# Patient Record
Sex: Female | Born: 1935
Health system: Southern US, Community
[De-identification: ages and names within clinical notes are randomized; demographics above are authoritative.]

## PROBLEM LIST (undated history)

## (undated) DIAGNOSIS — Z974 Presence of external hearing-aid: Secondary | ICD-10-CM

## (undated) DIAGNOSIS — N390 Urinary tract infection, site not specified: Secondary | ICD-10-CM

## (undated) DIAGNOSIS — M1990 Unspecified osteoarthritis, unspecified site: Secondary | ICD-10-CM

## (undated) DIAGNOSIS — E039 Hypothyroidism, unspecified: Secondary | ICD-10-CM

## (undated) DIAGNOSIS — I10 Essential (primary) hypertension: Secondary | ICD-10-CM

## (undated) DIAGNOSIS — M810 Age-related osteoporosis without current pathological fracture: Secondary | ICD-10-CM

## (undated) DIAGNOSIS — J301 Allergic rhinitis due to pollen: Secondary | ICD-10-CM

## (undated) HISTORY — DX: Essential (primary) hypertension: I10

## (undated) HISTORY — DX: Allergic rhinitis due to pollen: J30.1

## (undated) HISTORY — DX: Hypothyroidism, unspecified: E03.9

## (undated) HISTORY — DX: Age-related osteoporosis without current pathological fracture: M81.0

---

## 1982-06-25 HISTORY — PX: TOTAL ABDOMINAL HYSTERECTOMY: SHX209

## 2005-11-22 ENCOUNTER — Ambulatory Visit: Payer: Self-pay | Admitting: Internal Medicine

## 2006-01-29 ENCOUNTER — Ambulatory Visit: Payer: Self-pay

## 2006-01-29 ENCOUNTER — Emergency Department: Payer: Self-pay | Admitting: Emergency Medicine

## 2007-07-01 ENCOUNTER — Ambulatory Visit: Payer: Self-pay | Admitting: Internal Medicine

## 2008-11-19 ENCOUNTER — Ambulatory Visit: Payer: Self-pay | Admitting: Internal Medicine

## 2009-11-23 ENCOUNTER — Ambulatory Visit: Payer: Self-pay | Admitting: Internal Medicine

## 2011-07-11 ENCOUNTER — Ambulatory Visit: Payer: Self-pay

## 2012-10-20 ENCOUNTER — Ambulatory Visit: Payer: Self-pay | Admitting: Internal Medicine

## 2013-04-27 ENCOUNTER — Ambulatory Visit: Payer: Self-pay

## 2013-07-08 ENCOUNTER — Ambulatory Visit: Payer: Self-pay | Admitting: Family Medicine

## 2013-07-08 DIAGNOSIS — S0003XA Contusion of scalp, initial encounter: Secondary | ICD-10-CM | POA: Diagnosis not present

## 2013-07-08 DIAGNOSIS — S1093XA Contusion of unspecified part of neck, initial encounter: Secondary | ICD-10-CM | POA: Diagnosis not present

## 2013-07-08 DIAGNOSIS — R51 Headache: Secondary | ICD-10-CM | POA: Diagnosis not present

## 2013-07-08 DIAGNOSIS — S0083XA Contusion of other part of head, initial encounter: Secondary | ICD-10-CM | POA: Diagnosis not present

## 2013-07-13 DIAGNOSIS — E2839 Other primary ovarian failure: Secondary | ICD-10-CM | POA: Diagnosis not present

## 2013-09-17 DIAGNOSIS — E782 Mixed hyperlipidemia: Secondary | ICD-10-CM | POA: Diagnosis not present

## 2013-09-17 DIAGNOSIS — E038 Other specified hypothyroidism: Secondary | ICD-10-CM | POA: Diagnosis not present

## 2013-09-17 DIAGNOSIS — I10 Essential (primary) hypertension: Secondary | ICD-10-CM | POA: Diagnosis not present

## 2013-09-17 DIAGNOSIS — Z0000 Encounter for general adult medical examination without abnormal findings: Secondary | ICD-10-CM | POA: Diagnosis not present

## 2013-09-17 DIAGNOSIS — E559 Vitamin D deficiency, unspecified: Secondary | ICD-10-CM | POA: Diagnosis not present

## 2013-09-23 DIAGNOSIS — R5381 Other malaise: Secondary | ICD-10-CM | POA: Diagnosis not present

## 2013-09-23 DIAGNOSIS — R5383 Other fatigue: Secondary | ICD-10-CM | POA: Diagnosis not present

## 2013-09-23 DIAGNOSIS — E039 Hypothyroidism, unspecified: Secondary | ICD-10-CM | POA: Diagnosis not present

## 2013-09-23 DIAGNOSIS — L658 Other specified nonscarring hair loss: Secondary | ICD-10-CM | POA: Diagnosis not present

## 2014-03-22 DIAGNOSIS — E039 Hypothyroidism, unspecified: Secondary | ICD-10-CM | POA: Diagnosis not present

## 2014-03-22 DIAGNOSIS — I10 Essential (primary) hypertension: Secondary | ICD-10-CM | POA: Diagnosis not present

## 2014-03-22 DIAGNOSIS — N390 Urinary tract infection, site not specified: Secondary | ICD-10-CM | POA: Diagnosis not present

## 2014-03-22 DIAGNOSIS — M810 Age-related osteoporosis without current pathological fracture: Secondary | ICD-10-CM | POA: Diagnosis not present

## 2014-04-27 DIAGNOSIS — H2513 Age-related nuclear cataract, bilateral: Secondary | ICD-10-CM | POA: Diagnosis not present

## 2014-04-28 DIAGNOSIS — Z23 Encounter for immunization: Secondary | ICD-10-CM | POA: Diagnosis not present

## 2014-08-03 DIAGNOSIS — E039 Hypothyroidism, unspecified: Secondary | ICD-10-CM | POA: Diagnosis not present

## 2014-08-03 DIAGNOSIS — I10 Essential (primary) hypertension: Secondary | ICD-10-CM | POA: Diagnosis not present

## 2014-08-03 DIAGNOSIS — M810 Age-related osteoporosis without current pathological fracture: Secondary | ICD-10-CM | POA: Diagnosis not present

## 2014-08-03 DIAGNOSIS — N390 Urinary tract infection, site not specified: Secondary | ICD-10-CM | POA: Diagnosis not present

## 2014-08-03 DIAGNOSIS — R300 Dysuria: Secondary | ICD-10-CM | POA: Diagnosis not present

## 2014-08-03 DIAGNOSIS — R319 Hematuria, unspecified: Secondary | ICD-10-CM | POA: Diagnosis not present

## 2014-09-23 DIAGNOSIS — R319 Hematuria, unspecified: Secondary | ICD-10-CM | POA: Diagnosis not present

## 2014-09-23 DIAGNOSIS — I10 Essential (primary) hypertension: Secondary | ICD-10-CM | POA: Diagnosis not present

## 2014-09-23 DIAGNOSIS — N390 Urinary tract infection, site not specified: Secondary | ICD-10-CM | POA: Diagnosis not present

## 2014-09-23 DIAGNOSIS — E039 Hypothyroidism, unspecified: Secondary | ICD-10-CM | POA: Diagnosis not present

## 2014-09-23 DIAGNOSIS — M810 Age-related osteoporosis without current pathological fracture: Secondary | ICD-10-CM | POA: Diagnosis not present

## 2014-11-30 DIAGNOSIS — Z0001 Encounter for general adult medical examination with abnormal findings: Secondary | ICD-10-CM | POA: Diagnosis not present

## 2014-11-30 DIAGNOSIS — E559 Vitamin D deficiency, unspecified: Secondary | ICD-10-CM | POA: Diagnosis not present

## 2014-11-30 DIAGNOSIS — I10 Essential (primary) hypertension: Secondary | ICD-10-CM | POA: Diagnosis not present

## 2014-11-30 DIAGNOSIS — E039 Hypothyroidism, unspecified: Secondary | ICD-10-CM | POA: Diagnosis not present

## 2014-12-02 DIAGNOSIS — I10 Essential (primary) hypertension: Secondary | ICD-10-CM | POA: Diagnosis not present

## 2014-12-02 DIAGNOSIS — N390 Urinary tract infection, site not specified: Secondary | ICD-10-CM | POA: Diagnosis not present

## 2014-12-02 DIAGNOSIS — R300 Dysuria: Secondary | ICD-10-CM | POA: Diagnosis not present

## 2014-12-02 DIAGNOSIS — E039 Hypothyroidism, unspecified: Secondary | ICD-10-CM | POA: Diagnosis not present

## 2014-12-02 DIAGNOSIS — Z0001 Encounter for general adult medical examination with abnormal findings: Secondary | ICD-10-CM | POA: Diagnosis not present

## 2014-12-02 DIAGNOSIS — M810 Age-related osteoporosis without current pathological fracture: Secondary | ICD-10-CM | POA: Diagnosis not present

## 2014-12-04 DIAGNOSIS — Z111 Encounter for screening for respiratory tuberculosis: Secondary | ICD-10-CM | POA: Diagnosis not present

## 2014-12-23 DIAGNOSIS — Z1231 Encounter for screening mammogram for malignant neoplasm of breast: Secondary | ICD-10-CM | POA: Diagnosis not present

## 2015-07-21 DIAGNOSIS — I10 Essential (primary) hypertension: Secondary | ICD-10-CM | POA: Diagnosis not present

## 2015-07-21 DIAGNOSIS — E039 Hypothyroidism, unspecified: Secondary | ICD-10-CM | POA: Diagnosis not present

## 2015-07-21 DIAGNOSIS — N390 Urinary tract infection, site not specified: Secondary | ICD-10-CM | POA: Diagnosis not present

## 2015-07-21 DIAGNOSIS — M810 Age-related osteoporosis without current pathological fracture: Secondary | ICD-10-CM | POA: Diagnosis not present

## 2015-12-08 DIAGNOSIS — M810 Age-related osteoporosis without current pathological fracture: Secondary | ICD-10-CM | POA: Diagnosis not present

## 2015-12-08 DIAGNOSIS — R300 Dysuria: Secondary | ICD-10-CM | POA: Diagnosis not present

## 2015-12-08 DIAGNOSIS — E039 Hypothyroidism, unspecified: Secondary | ICD-10-CM | POA: Diagnosis not present

## 2015-12-08 DIAGNOSIS — N390 Urinary tract infection, site not specified: Secondary | ICD-10-CM | POA: Diagnosis not present

## 2015-12-08 DIAGNOSIS — Z0001 Encounter for general adult medical examination with abnormal findings: Secondary | ICD-10-CM | POA: Diagnosis not present

## 2015-12-08 DIAGNOSIS — I10 Essential (primary) hypertension: Secondary | ICD-10-CM | POA: Diagnosis not present

## 2016-01-23 DIAGNOSIS — D509 Iron deficiency anemia, unspecified: Secondary | ICD-10-CM | POA: Diagnosis not present

## 2016-01-23 DIAGNOSIS — E039 Hypothyroidism, unspecified: Secondary | ICD-10-CM | POA: Diagnosis not present

## 2016-01-23 DIAGNOSIS — M25511 Pain in right shoulder: Secondary | ICD-10-CM | POA: Diagnosis not present

## 2016-01-23 DIAGNOSIS — N390 Urinary tract infection, site not specified: Secondary | ICD-10-CM | POA: Diagnosis not present

## 2016-01-23 DIAGNOSIS — I10 Essential (primary) hypertension: Secondary | ICD-10-CM | POA: Diagnosis not present

## 2016-01-23 DIAGNOSIS — Z0001 Encounter for general adult medical examination with abnormal findings: Secondary | ICD-10-CM | POA: Diagnosis not present

## 2016-01-23 DIAGNOSIS — E559 Vitamin D deficiency, unspecified: Secondary | ICD-10-CM | POA: Diagnosis not present

## 2016-01-23 DIAGNOSIS — R5383 Other fatigue: Secondary | ICD-10-CM | POA: Diagnosis not present

## 2016-02-10 DIAGNOSIS — R399 Unspecified symptoms and signs involving the genitourinary system: Secondary | ICD-10-CM | POA: Diagnosis not present

## 2016-02-10 DIAGNOSIS — R531 Weakness: Secondary | ICD-10-CM | POA: Diagnosis not present

## 2016-02-14 DIAGNOSIS — D72819 Decreased white blood cell count, unspecified: Secondary | ICD-10-CM | POA: Diagnosis not present

## 2016-02-14 DIAGNOSIS — I10 Essential (primary) hypertension: Secondary | ICD-10-CM | POA: Diagnosis not present

## 2016-02-14 DIAGNOSIS — N390 Urinary tract infection, site not specified: Secondary | ICD-10-CM | POA: Diagnosis not present

## 2016-02-14 DIAGNOSIS — E039 Hypothyroidism, unspecified: Secondary | ICD-10-CM | POA: Diagnosis not present

## 2016-02-14 DIAGNOSIS — D696 Thrombocytopenia, unspecified: Secondary | ICD-10-CM | POA: Diagnosis not present

## 2016-02-28 DIAGNOSIS — D696 Thrombocytopenia, unspecified: Secondary | ICD-10-CM | POA: Diagnosis not present

## 2016-03-07 DIAGNOSIS — I10 Essential (primary) hypertension: Secondary | ICD-10-CM | POA: Diagnosis not present

## 2016-03-07 DIAGNOSIS — R300 Dysuria: Secondary | ICD-10-CM | POA: Diagnosis not present

## 2016-03-07 DIAGNOSIS — N390 Urinary tract infection, site not specified: Secondary | ICD-10-CM | POA: Diagnosis not present

## 2016-03-07 DIAGNOSIS — D72819 Decreased white blood cell count, unspecified: Secondary | ICD-10-CM | POA: Diagnosis not present

## 2016-04-20 DIAGNOSIS — M25511 Pain in right shoulder: Secondary | ICD-10-CM | POA: Diagnosis not present

## 2016-04-24 DIAGNOSIS — M7541 Impingement syndrome of right shoulder: Secondary | ICD-10-CM | POA: Diagnosis not present

## 2016-05-02 DIAGNOSIS — M6281 Muscle weakness (generalized): Secondary | ICD-10-CM | POA: Diagnosis not present

## 2016-05-04 DIAGNOSIS — R609 Edema, unspecified: Secondary | ICD-10-CM | POA: Diagnosis not present

## 2016-05-04 DIAGNOSIS — M25611 Stiffness of right shoulder, not elsewhere classified: Secondary | ICD-10-CM | POA: Diagnosis not present

## 2016-05-04 DIAGNOSIS — M25511 Pain in right shoulder: Secondary | ICD-10-CM | POA: Diagnosis not present

## 2016-05-08 DIAGNOSIS — M25611 Stiffness of right shoulder, not elsewhere classified: Secondary | ICD-10-CM | POA: Diagnosis not present

## 2016-05-08 DIAGNOSIS — M25511 Pain in right shoulder: Secondary | ICD-10-CM | POA: Diagnosis not present

## 2016-05-11 DIAGNOSIS — M25511 Pain in right shoulder: Secondary | ICD-10-CM | POA: Diagnosis not present

## 2016-05-11 DIAGNOSIS — M25611 Stiffness of right shoulder, not elsewhere classified: Secondary | ICD-10-CM | POA: Diagnosis not present

## 2016-05-15 DIAGNOSIS — M25611 Stiffness of right shoulder, not elsewhere classified: Secondary | ICD-10-CM | POA: Diagnosis not present

## 2016-05-15 DIAGNOSIS — M25511 Pain in right shoulder: Secondary | ICD-10-CM | POA: Diagnosis not present

## 2016-05-21 DIAGNOSIS — M25511 Pain in right shoulder: Secondary | ICD-10-CM | POA: Diagnosis not present

## 2016-05-21 DIAGNOSIS — M25611 Stiffness of right shoulder, not elsewhere classified: Secondary | ICD-10-CM | POA: Diagnosis not present

## 2016-05-24 DIAGNOSIS — M25511 Pain in right shoulder: Secondary | ICD-10-CM | POA: Diagnosis not present

## 2016-05-24 DIAGNOSIS — M25611 Stiffness of right shoulder, not elsewhere classified: Secondary | ICD-10-CM | POA: Diagnosis not present

## 2016-06-04 DIAGNOSIS — N390 Urinary tract infection, site not specified: Secondary | ICD-10-CM | POA: Diagnosis not present

## 2016-06-04 DIAGNOSIS — I10 Essential (primary) hypertension: Secondary | ICD-10-CM | POA: Diagnosis not present

## 2016-06-04 DIAGNOSIS — M25511 Pain in right shoulder: Secondary | ICD-10-CM | POA: Diagnosis not present

## 2016-06-04 DIAGNOSIS — Z23 Encounter for immunization: Secondary | ICD-10-CM | POA: Diagnosis not present

## 2016-06-04 DIAGNOSIS — M810 Age-related osteoporosis without current pathological fracture: Secondary | ICD-10-CM | POA: Diagnosis not present

## 2016-06-06 ENCOUNTER — Ambulatory Visit (INDEPENDENT_AMBULATORY_CARE_PROVIDER_SITE_OTHER): Payer: Commercial Managed Care - HMO | Admitting: Podiatry

## 2016-06-06 ENCOUNTER — Other Ambulatory Visit: Payer: Self-pay | Admitting: *Deleted

## 2016-06-06 ENCOUNTER — Encounter: Payer: Self-pay | Admitting: Podiatry

## 2016-06-06 ENCOUNTER — Ambulatory Visit (INDEPENDENT_AMBULATORY_CARE_PROVIDER_SITE_OTHER): Payer: Commercial Managed Care - HMO

## 2016-06-06 VITALS — BP 142/83 | HR 66 | Resp 16

## 2016-06-06 DIAGNOSIS — G5762 Lesion of plantar nerve, left lower limb: Secondary | ICD-10-CM | POA: Diagnosis not present

## 2016-06-06 DIAGNOSIS — G5760 Lesion of plantar nerve, unspecified lower limb: Secondary | ICD-10-CM | POA: Diagnosis not present

## 2016-06-06 DIAGNOSIS — M2041 Other hammer toe(s) (acquired), right foot: Secondary | ICD-10-CM | POA: Diagnosis not present

## 2016-06-06 DIAGNOSIS — G588 Other specified mononeuropathies: Secondary | ICD-10-CM

## 2016-06-06 DIAGNOSIS — M2042 Other hammer toe(s) (acquired), left foot: Secondary | ICD-10-CM

## 2016-06-06 NOTE — Progress Notes (Signed)
   Subjective:    Patient ID: Samantha Dennis, female    DOB: 11/12/1933, 82 y.o.   MRN: [id number]  HPI: She presents today complaining of pain to the distal aspects of the third and fourth toes bilaterally. States that the tip of the toes her and that she had this once before and we diagnosed her with neuroma or neuritis and she would like to try that treatment again. States that she went through for alcohol treatments and the fifth when she did not need.  Review of Systems  Musculoskeletal: Positive for arthralgias.  All other systems reviewed and are negative.      Objective:   Physical Exam: Vital signs are stable she's alert and oriented 3. Pulses are palpable. Neurologic sensorium is intact. No reproducible Mulder's click no pain on palpation to the third interdigital space. Hammertoe deformities are present and fairly rigid nature. As confirmed by radiographs today with osteoarthritic changes in the midfoot and forefoot. No fractures are identified. No open lesions or wounds are noted.     Assessment & Plan:  Hammertoe deformities with neuritis third and fourth toes lateral.  Plan: After discussion with her recommending that is more than likely the curvature of the toes rather than the tips of the toes or a neuroma I conceded and injected her third interdigital space of dehydrated alcohol will follow up with her in 3 weeks for another bilateral injection

## 2016-06-27 ENCOUNTER — Encounter: Payer: Self-pay | Admitting: Podiatry

## 2016-06-27 ENCOUNTER — Ambulatory Visit (INDEPENDENT_AMBULATORY_CARE_PROVIDER_SITE_OTHER): Payer: Commercial Managed Care - HMO | Admitting: Podiatry

## 2016-06-27 DIAGNOSIS — G5762 Lesion of plantar nerve, left lower limb: Secondary | ICD-10-CM

## 2016-06-27 DIAGNOSIS — G588 Other specified mononeuropathies: Secondary | ICD-10-CM

## 2016-06-27 DIAGNOSIS — G5760 Lesion of plantar nerve, unspecified lower limb: Secondary | ICD-10-CM | POA: Diagnosis not present

## 2016-06-27 NOTE — Progress Notes (Signed)
She presents today for follow-up of neuroma third interdigital space bilateral states that his still somewhat bothersome but is better than it was before.  Objective: She has strong palpable pulses palpable neuroma third interdigital space bilateral. Tenderness on palpation.  Assessment: Pain in limb secondary to neuroma third interdigital space bilateral.  Plan: Reinjected her second dose of dehydrated alcohol to the third interdigital space today.

## 2016-07-18 ENCOUNTER — Ambulatory Visit: Payer: Commercial Managed Care - HMO | Admitting: Podiatry

## 2016-11-01 DIAGNOSIS — N390 Urinary tract infection, site not specified: Secondary | ICD-10-CM | POA: Diagnosis not present

## 2016-11-01 DIAGNOSIS — I10 Essential (primary) hypertension: Secondary | ICD-10-CM | POA: Diagnosis not present

## 2016-11-01 DIAGNOSIS — R319 Hematuria, unspecified: Secondary | ICD-10-CM | POA: Diagnosis not present

## 2016-11-02 DIAGNOSIS — N390 Urinary tract infection, site not specified: Secondary | ICD-10-CM | POA: Diagnosis not present

## 2016-11-08 DIAGNOSIS — M545 Low back pain: Secondary | ICD-10-CM | POA: Diagnosis not present

## 2016-11-08 DIAGNOSIS — R319 Hematuria, unspecified: Secondary | ICD-10-CM | POA: Diagnosis not present

## 2016-11-12 ENCOUNTER — Other Ambulatory Visit: Payer: Self-pay | Admitting: Nurse Practitioner

## 2016-11-12 DIAGNOSIS — R319 Hematuria, unspecified: Secondary | ICD-10-CM

## 2016-11-27 ENCOUNTER — Ambulatory Visit: Payer: Medicare HMO

## 2016-12-11 DIAGNOSIS — N390 Urinary tract infection, site not specified: Secondary | ICD-10-CM | POA: Diagnosis not present

## 2016-12-11 DIAGNOSIS — E039 Hypothyroidism, unspecified: Secondary | ICD-10-CM | POA: Diagnosis not present

## 2016-12-11 DIAGNOSIS — I10 Essential (primary) hypertension: Secondary | ICD-10-CM | POA: Diagnosis not present

## 2016-12-11 DIAGNOSIS — M810 Age-related osteoporosis without current pathological fracture: Secondary | ICD-10-CM | POA: Diagnosis not present

## 2016-12-11 DIAGNOSIS — Z0001 Encounter for general adult medical examination with abnormal findings: Secondary | ICD-10-CM | POA: Diagnosis not present

## 2016-12-11 DIAGNOSIS — R55 Syncope and collapse: Secondary | ICD-10-CM | POA: Diagnosis not present

## 2016-12-11 DIAGNOSIS — E785 Hyperlipidemia, unspecified: Secondary | ICD-10-CM | POA: Diagnosis not present

## 2016-12-31 DIAGNOSIS — R55 Syncope and collapse: Secondary | ICD-10-CM | POA: Diagnosis not present

## 2017-01-07 DIAGNOSIS — R55 Syncope and collapse: Secondary | ICD-10-CM | POA: Diagnosis not present

## 2017-03-27 DIAGNOSIS — L821 Other seborrheic keratosis: Secondary | ICD-10-CM | POA: Diagnosis not present

## 2017-03-27 DIAGNOSIS — D2262 Melanocytic nevi of left upper limb, including shoulder: Secondary | ICD-10-CM | POA: Diagnosis not present

## 2017-03-27 DIAGNOSIS — D2261 Melanocytic nevi of right upper limb, including shoulder: Secondary | ICD-10-CM | POA: Diagnosis not present

## 2017-03-27 DIAGNOSIS — D225 Melanocytic nevi of trunk: Secondary | ICD-10-CM | POA: Diagnosis not present

## 2017-04-24 DIAGNOSIS — Z0001 Encounter for general adult medical examination with abnormal findings: Secondary | ICD-10-CM | POA: Diagnosis not present

## 2017-04-24 DIAGNOSIS — I10 Essential (primary) hypertension: Secondary | ICD-10-CM | POA: Diagnosis not present

## 2017-04-24 DIAGNOSIS — E039 Hypothyroidism, unspecified: Secondary | ICD-10-CM | POA: Diagnosis not present

## 2017-04-24 DIAGNOSIS — M810 Age-related osteoporosis without current pathological fracture: Secondary | ICD-10-CM | POA: Diagnosis not present

## 2017-05-30 ENCOUNTER — Other Ambulatory Visit: Payer: Self-pay | Admitting: Internal Medicine

## 2017-05-30 DIAGNOSIS — E785 Hyperlipidemia, unspecified: Secondary | ICD-10-CM | POA: Diagnosis not present

## 2017-05-30 DIAGNOSIS — N905 Atrophy of vulva: Secondary | ICD-10-CM | POA: Diagnosis not present

## 2017-05-30 DIAGNOSIS — Z1231 Encounter for screening mammogram for malignant neoplasm of breast: Secondary | ICD-10-CM

## 2017-05-30 DIAGNOSIS — E039 Hypothyroidism, unspecified: Secondary | ICD-10-CM | POA: Diagnosis not present

## 2017-05-30 DIAGNOSIS — Z23 Encounter for immunization: Secondary | ICD-10-CM | POA: Diagnosis not present

## 2017-05-30 DIAGNOSIS — A6004 Herpesviral vulvovaginitis: Secondary | ICD-10-CM | POA: Diagnosis not present

## 2017-06-06 ENCOUNTER — Ambulatory Visit (INDEPENDENT_AMBULATORY_CARE_PROVIDER_SITE_OTHER): Payer: Medicare HMO | Admitting: Internal Medicine

## 2017-06-06 ENCOUNTER — Ambulatory Visit: Payer: Self-pay | Admitting: Internal Medicine

## 2017-06-06 ENCOUNTER — Encounter: Payer: Self-pay | Admitting: Internal Medicine

## 2017-06-06 VITALS — BP 161/76 | HR 79 | Resp 16 | Ht 61.0 in | Wt 141.2 lb

## 2017-06-06 DIAGNOSIS — R030 Elevated blood-pressure reading, without diagnosis of hypertension: Secondary | ICD-10-CM

## 2017-06-06 DIAGNOSIS — F418 Other specified anxiety disorders: Secondary | ICD-10-CM | POA: Diagnosis not present

## 2017-06-06 DIAGNOSIS — M810 Age-related osteoporosis without current pathological fracture: Secondary | ICD-10-CM | POA: Diagnosis not present

## 2017-06-06 DIAGNOSIS — R4589 Other symptoms and signs involving emotional state: Secondary | ICD-10-CM

## 2017-06-06 DIAGNOSIS — N952 Postmenopausal atrophic vaginitis: Secondary | ICD-10-CM

## 2017-06-06 MED ORDER — FAMCICLOVIR 250 MG PO TABS: 250.0000 mg | ORAL_TABLET | Freq: Every day | ORAL | 3 refills | Status: DC

## 2017-06-06 NOTE — Progress Notes (Signed)
[facility] [place], [place] [address]  Internal MEDICINE  Office Visit Note  Patient Name: Samantha Dennis  [id number]  [id number]  Date of Service: 06/06/2017     Complaints/HPI:  Pt is here with few concerns. 1. She is in a new relationship and wants to know about preventive therapy for remote HSV. 2. She is c/o vaginal irritation after intercourse. 3. She has been having anxiety due to this  4. Wants a standing prescription for Cipro for ongoing UTI  Current Medication: Outpatient Encounter Medications as of 06/06/2017  Medication Sig Note  . alendronate (FOSAMAX) 70 MG tablet  06/06/2016: Received from: External Pharmacy  . aspirin EC 81 MG tablet Take by mouth. 06/06/2016: Received from: [facility]: Take 81 mg by mouth once daily.  Marland Kitchen levothyroxine (SYNTHROID, LEVOTHROID) 75 MCG tablet  06/06/2016: Received from: [facility]  . lisinopril-hydrochlorothiazide (PRINZIDE,ZESTORETIC) 10-12.5 MG tablet  06/06/2016: Received from: [facility]   No facility-administered encounter medications on file as of 06/06/2017.     Surgical History: Past Surgical History:  Procedure Laterality Date  . TOTAL ABDOMINAL HYSTERECTOMY Bilateral 1984    Medical History: Past Medical History:  Diagnosis Date  . Hay fever   . Hypertension   . Hypothyroidism   . Osteoporosis     Family History: No family history on file.  Social History: Social History   Socioeconomic History  . Marital status: Married    Spouse name: Not on file  . Number of children: Not on file  . Years of education: Not on file  . Highest education level: Not on file  Social Needs  . Financial resource strain: Not on file  . Food insecurity - worry: Not on file  . Food insecurity - inability: Not on file  . Transportation needs - medical: Not on file  . Transportation needs - non-medical: Not on file  Occupational  History  . Not on file  Tobacco Use  . Smoking status: Never Smoker  . Smokeless tobacco: Never Used  Substance and Sexual Activity  . Alcohol use: Yes    Alcohol/week: 0.6 oz    Types: 1 Glasses of wine per week  . Drug use: No  . Sexual activity: Not on file  Other Topics Concern  . Not on file  Social History Narrative  . Not on file     ROS  General: (-) fever, (-) chills, (-) night sweats, (-) weakness, (-) changes in appetite. Skin: (-) rashes, (-) itching,. Eyes: (-) visual changes, (-) redness, (-) itching, (-) double or blurred vision. Nose and Sinuses: (-) nasal stuffiness or itchiness, (-) postnasal drip, (-) nosebleeds, (-) sinus trouble. Mouth and Throat: (-) sore throat, (-) hoarseness. Neck: (-) swollen glands, (-) enlarged thyroid, (-) neck pain. Respiratory: (-) cough, (-) bloody sputum, (-) shortness of breath, (-) wheezing. Cardiovascular: (-) ankle swelling, (-) chest pain. Lymphatic: (-) lymph node enlargement, (-) lymph node tenderness. Neurologic: (-) numbness, (-) tingling,(-) dizziness. Psychiatric: (-) anxiety, (-) depression. Vaginal irritation//   Vital Signs: Blood pressure (!) 161/76, pulse 79, resp. rate 16, height 5\' 1"  (1.549 m), weight 141 lb 3.2 oz (64 kg), SpO2 99 %.  Examination: General Appearance: The patient is well-developed, well-nourished, and anxious  Skin: Gross inspection of skin demonstrates no evidence of abnormality. Head: Patient's head is normocephalic, no gross deformities. Eyes: no gross deformities noted. ENT: ears appear grossly normal. Nasopharynx appears to be normal. Neck:  Supple. No thyromegaly. No LAD. Respiratory: Lungs are clear to auscultation with no adventitious sounds. Cardiovascular: Normal S1 and S2 without murmur or rub. Extremities: No cyanosis. pulses are equal. Neurologic: Alert and oriented. No involuntary movements. Pelvic exam is performed and cultures are taken for HSVII  Assessment and  Plan:   Encounter Diagnoses  Name Primary?  . Elevated blood pressure reading   . Atrophic vaginitis Yes  . Anxiety about health   Elevated Blood pressure . Monitor BP. Anxiety related  Start premarin vaginal cream, Apply 1/3 of applicator qweek Ressurance is given RX of Cipro and Famvir is sent to pharmacy   General Counseling: I have discussed the findings of the evaluation and examination with Samantha Dennis.  I have also discussed any further diagnostic evaluation thatmay be needed or ordered today. Samantha Dennis verbalizes understanding of the findings of todays visit. We also reviewed her medications today and discussed drug interactions and side effects including but not limited excessive drowsiness and altered mental states. We also discussed that there is always a risk not just to her but also people around her. she has been encouraged to call the office with any questions or concerns that should arise related to todays visit.    Time spent: 30  I have personally obtained a history, examined the patient, evaluated laboratory and imaging results, formulated the assessment and plan and placed orders.   [name], MD  Internal Medicine

## 2017-06-06 NOTE — Patient Instructions (Addendum)
Atrophic Vaginitis Atrophic vaginitis is a condition in which the tissues that line the vagina become dry and thin. This condition is most common in women who have stopped having regular menstrual periods (menopause). This usually starts when a woman is 45-81 years old. Estrogen helps to keep the vagina moist. It stimulates the vagina to produce a clear fluid that lubricates the vagina for sexual intercourse. This fluid also protects the vagina from infection. Lack of estrogen can cause the lining of the vagina to get thinner and dryer. The vagina may also shrink in size. It may become less elastic. Atrophic vaginitis tends to get worse over time as a woman's estrogen level drops. What are the causes? This condition is caused by the normal drop in estrogen that happens around the time of menopause. What increases the risk? Certain conditions or situations may lower a woman's estrogen level, which increases her risk of atrophic vaginitis. These include:  Taking medicine that blocks estrogen.  Having ovaries removed surgically.  Being treated for cancer with X-ray treatment (radiation) or medicines (chemotherapy).  Exercising very hard and often.  Having an eating disorder (anorexia).  Giving birth or breastfeeding.  Being over the age of 50.  Smoking.  What are the signs or symptoms? Symptoms of this condition include:  Pain, soreness, or bleeding during sexual intercourse (dyspareunia).  Vaginal burning, irritation, or itching.  Pain or bleeding during a vaginal examination using a speculum (pelvic exam).  Loss of interest in sexual activity.  Having burning pain when passing urine.  Vaginal discharge that is brown or yellow.  In some cases, there are no symptoms. How is this diagnosed? This condition is diagnosed with a medical history and physical exam. This will include a pelvic exam that checks whether the inside of your vagina appears pale, thin, or dry. Rarely, you may  also have other tests, including:  A urine test.  A test that checks the acid balance in your vaginal fluid (acid balance test).  How is this treated? Treatment for this condition may depend on the severity of your symptoms. Treatment may include:  Using an over-the-counter vaginal lubricant before you have sexual intercourse.  Using a long-acting vaginal moisturizer.  Using low-dose vaginal estrogen for moderate to severe symptoms that do not respond to other treatments. Options include creams, tablets, and inserts (vaginal rings). Before using vaginal estrogen, tell your health care provider if you have a history of: ? Breast cancer. ? Endometrial cancer. ? Blood clots.  Taking medicines. You may be able to take a daily pill for dyspareunia. Discuss all of the risks of this medicine with your health care provider. It is usually not recommended for women who have a family history or personal history of breast cancer.  If your symptoms are very mild and you are not sexually active, you may not need treatment. Follow these instructions at home:  Take medicines only as directed by your health care provider. Do not use herbal or alternative medicines unless your health care provider says that you can.  Use over-the-counter creams, lubricants, or moisturizers for dryness only as directed by your health care provider.  If your atrophic vaginitis is caused by menopause, discuss all of your menopausal symptoms and treatment options with your health care provider.  Do not douche.  Do not use products that can make your vagina dry. These include: ? Scented feminine sprays. ? Scented tampons. ? Scented soaps.  If it hurts to have sex, talk with your sexual   partner. Contact a health care provider if:  Your discharge looks different than normal.  Your vagina has an unusual smell.  You have new symptoms.  Your symptoms do not improve with treatment.  Your symptoms get worse. This  information is not intended to replace advice given to you by your health care provider. Make sure you discuss any questions you have with your health care provider. Document Released: 10/26/2014 Document Revised: 11/17/2015 Document Reviewed: 06/02/2014 Elsevier Interactive Patient Education  2018 [employer] Inc.  

## 2017-06-06 NOTE — Progress Notes (Deleted)
Pt is here for a sick visit. Pt is complaining of Vaginal pain and irritation

## 2017-06-06 NOTE — Progress Notes (Deleted)
   Subjective:    Patient ID: Samantha Dennis, female    DOB: 04-12-1934, 83 y.o.   MRN: [id number]  Pt is here for a sick visit. Pt is complaining of ***      Review of Systems     Objective:   Physical Exam  Genitourinary: There is tenderness on the left labia.          Assessment & Plan:

## 2017-07-01 ENCOUNTER — Ambulatory Visit
Admission: RE | Admit: 2017-07-01 | Discharge: 2017-07-01 | Disposition: A | Discharge status: Home / Self Care | Payer: Medicare HMO | Source: Ambulatory Visit | Attending: Internal Medicine | Admitting: Internal Medicine

## 2017-07-01 DIAGNOSIS — Z1231 Encounter for screening mammogram for malignant neoplasm of breast: Secondary | ICD-10-CM | POA: Insufficient documentation

## 2017-07-03 ENCOUNTER — Other Ambulatory Visit: Payer: Self-pay | Admitting: *Deleted

## 2017-07-03 ENCOUNTER — Inpatient Hospital Stay
Admission: RE | Admit: 2017-07-03 | Discharge: 2017-07-03 | Disposition: A | Discharge status: Home / Self Care | Payer: Self-pay | Source: Ambulatory Visit | Attending: *Deleted | Admitting: *Deleted

## 2017-07-03 DIAGNOSIS — Z9289 Personal history of other medical treatment: Secondary | ICD-10-CM

## 2017-07-12 DIAGNOSIS — L820 Inflamed seborrheic keratosis: Secondary | ICD-10-CM | POA: Diagnosis not present

## 2017-07-12 DIAGNOSIS — L538 Other specified erythematous conditions: Secondary | ICD-10-CM | POA: Diagnosis not present

## 2017-07-12 DIAGNOSIS — L648 Other androgenic alopecia: Secondary | ICD-10-CM | POA: Diagnosis not present

## 2017-10-21 ENCOUNTER — Other Ambulatory Visit: Payer: Self-pay | Admitting: Internal Medicine

## 2017-10-21 MED ORDER — LEVOTHYROXINE SODIUM 75 MCG PO TABS: 75.0000 ug | ORAL_TABLET | Freq: Every day | ORAL | 3 refills | Status: DC

## 2017-12-12 ENCOUNTER — Telehealth: Payer: Self-pay

## 2017-12-12 NOTE — Telephone Encounter (Signed)
Patient order for cologuard has been cancelled due to order has expired exceeded 365 days, if needs reordered will need to submit back to laboratory. Beth

## 2017-12-17 ENCOUNTER — Ambulatory Visit: Payer: Self-pay | Admitting: Internal Medicine

## 2018-01-07 ENCOUNTER — Ambulatory Visit (INDEPENDENT_AMBULATORY_CARE_PROVIDER_SITE_OTHER): Payer: Medicare HMO | Admitting: Adult Health

## 2018-01-07 ENCOUNTER — Encounter: Payer: Self-pay | Admitting: Internal Medicine

## 2018-01-07 ENCOUNTER — Other Ambulatory Visit: Payer: Self-pay | Admitting: Internal Medicine

## 2018-01-07 VITALS — BP 128/68 | HR 79 | Resp 16 | Ht 60.0 in | Wt 137.8 lb

## 2018-01-07 DIAGNOSIS — N952 Postmenopausal atrophic vaginitis: Secondary | ICD-10-CM

## 2018-01-07 DIAGNOSIS — M810 Age-related osteoporosis without current pathological fracture: Secondary | ICD-10-CM | POA: Diagnosis not present

## 2018-01-07 DIAGNOSIS — I10 Essential (primary) hypertension: Secondary | ICD-10-CM | POA: Diagnosis not present

## 2018-01-07 NOTE — Progress Notes (Signed)
[facility] [place], [place] [address]  Internal MEDICINE  Office Visit Note  Patient Name: Samantha Dennis  [id number]  [id number]  Date of Service: 01/07/2018  Chief Complaint  Patient presents with  . Follow-up  . Hypertension  . Hypothyroidism    HPI Pt here for follow up.  She has no complaints at this time and reports she feels great. She reports she has stopped using the Premarin cream she was prescribed at her last visit.  She states after reading about it, she didn't want to use it anymore.      Current Medication: Outpatient Encounter Medications as of 01/07/2018  Medication Sig Note  . alendronate (FOSAMAX) 70 MG tablet  06/06/2016: Received from: External Pharmacy  . famciclovir (FAMVIR) 250 MG tablet Take 1 tablet (250 mg total) by mouth daily.   Marland Kitchen levothyroxine (SYNTHROID, LEVOTHROID) 75 MCG tablet Take 1 tablet (75 mcg total) by mouth daily before breakfast.   . lisinopril-hydrochlorothiazide (PRINZIDE,ZESTORETIC) 10-12.5 MG tablet  06/06/2016: Received from: [facility]  . aspirin EC 81 MG tablet Take by mouth. 06/06/2016: Received from: [facility]: Take 81 mg by mouth once daily.   No facility-administered encounter medications on file as of 01/07/2018.     Surgical History: Past Surgical History:  Procedure Laterality Date  . TOTAL ABDOMINAL HYSTERECTOMY Bilateral 1984    Medical History: Past Medical History:  Diagnosis Date  . Hay fever   . Hypertension   . Hypothyroidism   . Osteoporosis     Family History: Family History  Problem Relation Age of Onset  . Breast cancer Neg Hx     Social History   Socioeconomic History  . Marital status: Married    Spouse name: Not on file  . Number of children: Not on file  . Years of education: Not on file  . Highest education level: Not on file  Occupational History  . Not on file  Social Needs  . Financial resource strain:  Not on file  . Food insecurity:    Worry: Not on file    Inability: Not on file  . Transportation needs:    Medical: Not on file    Non-medical: Not on file  Tobacco Use  . Smoking status: Never Smoker  . Smokeless tobacco: Never Used  Substance and Sexual Activity  . Alcohol use: Yes    Alcohol/week: 0.6 oz    Types: 1 Glasses of wine per week  . Drug use: No  . Sexual activity: Not on file  Lifestyle  . Physical activity:    Days per week: Not on file    Minutes per session: Not on file  . Stress: Not on file  Relationships  . Social connections:    Talks on phone: Not on file    Gets together: Not on file    Attends religious service: Not on file    Active member of club or organization: Not on file    Attends meetings of clubs or organizations: Not on file    Relationship status: Not on file  . Intimate partner violence:    Fear of current or ex partner: Not on file    Emotionally abused: Not on file    Physically abused: Not on file    Forced sexual activity: Not on file  Other Topics Concern  . Not on file  Social History Narrative  . Not on file  Review of Systems  Constitutional: Negative for chills, fatigue and unexpected weight change.  HENT: Negative for congestion, rhinorrhea, sneezing and sore throat.   Eyes: Negative for photophobia, pain and redness.  Respiratory: Negative for cough, chest tightness and shortness of breath.   Cardiovascular: Negative for chest pain and palpitations.  Gastrointestinal: Negative for abdominal pain, constipation, diarrhea, nausea and vomiting.  Endocrine: Negative.   Genitourinary: Negative for dysuria and frequency.  Musculoskeletal: Negative for arthralgias, back pain, joint swelling and neck pain.  Skin: Negative for rash.  Allergic/Immunologic: Negative.   Neurological: Negative for tremors and numbness.  Hematological: Negative for adenopathy. Does not bruise/bleed easily.  Psychiatric/Behavioral: Negative  for behavioral problems and sleep disturbance. The patient is not nervous/anxious.     Vital Signs: BP 128/68   Pulse 79   Resp 16   Ht 5' (1.524 m)   Wt 137 lb 12.8 oz (62.5 kg)   SpO2 96%   BMI 26.91 kg/m    Physical Exam  Constitutional: She is oriented to person, place, and time. She appears well-developed and well-nourished. No distress.  HENT:  Head: Normocephalic and atraumatic.  Mouth/Throat: Oropharynx is clear and moist. No oropharyngeal exudate.  Eyes: Pupils are equal, round, and reactive to light. EOM are normal.  Neck: Normal range of motion. Neck supple. No JVD present. No tracheal deviation present. No thyromegaly present.  Cardiovascular: Normal rate, regular rhythm and normal heart sounds. Exam reveals no gallop and no friction rub.  No murmur heard. Pulmonary/Chest: Effort normal and breath sounds normal. No respiratory distress. She has no wheezes. She has no rales. She exhibits no tenderness.  Abdominal: Soft. There is no tenderness. There is no guarding.  Musculoskeletal: Normal range of motion.  Lymphadenopathy:    She has no cervical adenopathy.  Neurological: She is alert and oriented to person, place, and time. No cranial nerve deficit.  Skin: Skin is warm and dry. She is not diaphoretic.  Psychiatric: She has a normal mood and affect. Her behavior is normal. Judgment and thought content normal.  Nursing note and vitals reviewed.   Assessment/Plan: 1. Atrophic vaginitis No longer using medication. Denies complaints.     2. Age-related osteoporosis without current pathological fracture Continue Fosamax as prescribed.   3. Essential hypertension, benign Continue to monitor  General Counseling: Samantha Dennis understanding of the findings of todays visit and agrees with plan of treatment. I have discussed any further diagnostic evaluation that may be needed or ordered today. We also reviewed her medications today. she has been encouraged to call the  office with any questions or concerns that should arise related to todays visit.   Time spent: 20 Minutes   This patient was seen by Orson Gear AGNP-C in Collaboration with Dr [name] as a part of collaborative care agreement    Dr [name] Internal medicine

## 2018-05-29 ENCOUNTER — Ambulatory Visit: Payer: Self-pay | Admitting: Adult Health

## 2018-05-30 ENCOUNTER — Other Ambulatory Visit: Payer: Self-pay | Admitting: Internal Medicine

## 2018-06-23 ENCOUNTER — Other Ambulatory Visit: Payer: Self-pay

## 2018-06-23 MED ORDER — ROSUVASTATIN CALCIUM 5 MG PO TABS: 5.0000 mg | ORAL_TABLET | Freq: Every day | ORAL | 1 refills | Status: DC

## 2018-06-24 ENCOUNTER — Ambulatory Visit: Payer: Self-pay | Admitting: Nurse Practitioner

## 2018-07-01 ENCOUNTER — Other Ambulatory Visit: Payer: Self-pay

## 2018-07-01 ENCOUNTER — Telehealth: Payer: Self-pay

## 2018-07-02 ENCOUNTER — Other Ambulatory Visit: Payer: Self-pay | Admitting: Nurse Practitioner

## 2018-07-02 DIAGNOSIS — E039 Hypothyroidism, unspecified: Secondary | ICD-10-CM | POA: Diagnosis not present

## 2018-07-02 DIAGNOSIS — Z0001 Encounter for general adult medical examination with abnormal findings: Secondary | ICD-10-CM | POA: Diagnosis not present

## 2018-07-02 DIAGNOSIS — I10 Essential (primary) hypertension: Secondary | ICD-10-CM | POA: Diagnosis not present

## 2018-07-02 DIAGNOSIS — E559 Vitamin D deficiency, unspecified: Secondary | ICD-10-CM | POA: Diagnosis not present

## 2018-07-02 NOTE — Telephone Encounter (Signed)
GAVE TO COURTNEY TO CALL HER AND NOTIFY HER.

## 2018-07-02 NOTE — Telephone Encounter (Signed)
I filled out a lab slip. She can pick it up or we can mail it.

## 2018-07-03 LAB — TSH: TSH: 2.37 u[IU]/mL (ref 0.450–4.500)

## 2018-07-03 LAB — COMPREHENSIVE METABOLIC PANEL
ALT: 19 IU/L (ref 0–32)
AST: 22 IU/L (ref 0–40)
Albumin/Globulin Ratio: 2.3 — ABNORMAL HIGH (ref 1.2–2.2)
Albumin: 4.5 g/dL (ref 3.5–4.7)
Alkaline Phosphatase: 44 IU/L (ref 39–117)
BUN / CREAT RATIO: 19 (ref 12–28)
BUN: 13 mg/dL (ref 8–27)
Bilirubin Total: 0.7 mg/dL (ref 0.0–1.2)
CO2: 26 mmol/L (ref 20–29)
Calcium: 9.2 mg/dL (ref 8.7–10.3)
Chloride: 100 mmol/L (ref 96–106)
Creatinine, Ser: 0.68 mg/dL (ref 0.57–1.00)
GFR calc non Af Amer: 82 mL/min/{1.73_m2} (ref >59)
GFR, EST AFRICAN AMERICAN: 94 mL/min/{1.73_m2} (ref >59)
Globulin, Total: 2 g/dL (ref 1.5–4.5)
Glucose: 86 mg/dL (ref 65–99)
Potassium: 4.2 mmol/L (ref 3.5–5.2)
Sodium: 140 mmol/L (ref 134–144)
TOTAL PROTEIN: 6.5 g/dL (ref 6.0–8.5)

## 2018-07-03 LAB — CBC
HEMATOCRIT: 43.8 % (ref 34.0–46.6)
Hemoglobin: 14.8 g/dL (ref 11.1–15.9)
MCH: 31.9 pg (ref 26.6–33.0)
MCHC: 33.8 g/dL (ref 31.5–35.7)
MCV: 94 fL (ref 79–97)
Platelets: 180 10*3/uL (ref 150–450)
RBC: 4.64 x10E6/uL (ref 3.77–5.28)
RDW: 12.8 % (ref 11.7–15.4)
WBC: 5.6 10*3/uL (ref 3.4–10.8)

## 2018-07-03 LAB — LIPID PANEL W/O CHOL/HDL RATIO
Cholesterol, Total: 192 mg/dL (ref 100–199)
HDL: 96 mg/dL (ref >39)
LDL Calculated: 83 mg/dL (ref 0–99)
Triglycerides: 63 mg/dL (ref 0–149)
VLDL Cholesterol Cal: 13 mg/dL (ref 5–40)

## 2018-07-03 LAB — VITAMIN D 25 HYDROXY (VIT D DEFICIENCY, FRACTURES): Vit D, 25-Hydroxy: 46.9 ng/mL (ref 30.0–100.0)

## 2018-07-03 LAB — T4, FREE: Free T4: 1.64 ng/dL (ref 0.82–1.77)

## 2018-07-03 LAB — T3: T3, Total: 82 ng/dL (ref 71–180)

## 2018-07-04 ENCOUNTER — Encounter: Payer: Self-pay | Admitting: Nurse Practitioner

## 2018-07-04 ENCOUNTER — Ambulatory Visit (INDEPENDENT_AMBULATORY_CARE_PROVIDER_SITE_OTHER): Payer: Medicare HMO | Admitting: Nurse Practitioner

## 2018-07-04 VITALS — BP 141/61 | HR 65 | Resp 16 | Ht 61.0 in | Wt 140.0 lb

## 2018-07-04 DIAGNOSIS — Z0001 Encounter for general adult medical examination with abnormal findings: Secondary | ICD-10-CM | POA: Diagnosis not present

## 2018-07-04 DIAGNOSIS — R300 Dysuria: Secondary | ICD-10-CM

## 2018-07-04 DIAGNOSIS — M810 Age-related osteoporosis without current pathological fracture: Secondary | ICD-10-CM

## 2018-07-04 DIAGNOSIS — Z23 Encounter for immunization: Secondary | ICD-10-CM | POA: Diagnosis not present

## 2018-07-04 DIAGNOSIS — I10 Essential (primary) hypertension: Secondary | ICD-10-CM

## 2018-07-04 NOTE — Progress Notes (Signed)
[facility], [place] [address]  Internal MEDICINE  Office Visit Note  Patient Name: Samantha Dennis  [id number]  [id number]  Date of Service: 07/09/2018   Pt is here for routine health maintenance examination   Chief Complaint  Patient presents with  . Medicare Wellness    6 month well visit  . Hypertension  . Quality Metric Gaps    pt will get the flu vaccine from pharmacy     Hypertension  This is a chronic problem. The current episode started more than 1 year ago. The problem is unchanged. The problem is controlled. Pertinent negatives include no chest pain, headaches, neck pain, palpitations or shortness of breath. Agents associated with hypertension include thyroid hormones. Risk factors for coronary artery disease include post-menopausal state. Past treatments include diuretics and ACE inhibitors. The current treatment provides moderate improvement. There are no compliance problems.     Current Medication: Outpatient Encounter Medications as of 07/04/2018  Medication Sig Note  . alendronate (FOSAMAX) 70 MG tablet  06/06/2016: Received from: External Pharmacy  . aspirin EC 81 MG tablet Take by mouth. 06/06/2016: Received from: [place]: Take 81 mg by mouth once daily.  . famciclovir (FAMVIR) 250 MG tablet TAKE 1 TABLET (250 MG TOTAL) BY MOUTH DAILY.   Marland Kitchen ibandronate (BONIVA) 150 MG tablet TAKE 1 TABLET EVERY MONTH ON AN EMPTY STOMACH   . levothyroxine (SYNTHROID, LEVOTHROID) 75 MCG tablet Take 1 tablet (75 mcg total) by mouth daily before breakfast.   . lisinopril-hydrochlorothiazide (PRINZIDE,ZESTORETIC) 10-12.5 MG tablet  06/06/2016: Received from: [facility]  . rosuvastatin (CRESTOR) 5 MG tablet Take 1 tablet (5 mg total) by mouth daily.    No facility-administered encounter medications on file as of 07/04/2018.     Surgical History: Past Surgical History:  Procedure Laterality Date   . TOTAL ABDOMINAL HYSTERECTOMY Bilateral 1984    Medical History: Past Medical History:  Diagnosis Date  . Hay fever   . Hypertension   . Hypothyroidism   . Osteoporosis     Family History: Family History  Problem Relation Age of Onset  . Breast cancer Neg Hx       Review of Systems  Constitutional: Negative for activity change, chills, fatigue and unexpected weight change.  HENT: Negative for congestion, postnasal drip, rhinorrhea, sneezing and sore throat.   Respiratory: Negative for cough, chest tightness, shortness of breath and wheezing.   Cardiovascular: Negative for chest pain and palpitations.  Gastrointestinal: Negative for abdominal pain, constipation, diarrhea, nausea and vomiting.  Endocrine: Negative for cold intolerance, heat intolerance, polydipsia and polyuria.  Genitourinary: Negative for dysuria, frequency, hematuria and urgency.  Musculoskeletal: Negative for arthralgias, back pain, joint swelling and neck pain.  Skin: Negative for rash.  Allergic/Immunologic: Negative for environmental allergies.  Neurological: Negative for dizziness, tremors, numbness and headaches.  Hematological: Negative for adenopathy. Does not bruise/bleed easily.  Psychiatric/Behavioral: Negative for behavioral problems (Depression), sleep disturbance and suicidal ideas. The patient is not nervous/anxious.      Today's Vitals   07/04/18 1109  BP: (!) 141/61  Pulse: 65  Resp: 16  SpO2: 99%  Weight: 140 lb (63.5 kg)  Height: 5\' 1"  (1.549 m)    Physical Exam Vitals signs and nursing note reviewed.  Constitutional:      General: She is not in acute distress.    Appearance: Normal appearance. She is well-developed. She is not diaphoretic.  HENT:     Head:  Normocephalic and atraumatic.     Mouth/Throat:     Pharynx: No oropharyngeal exudate.  Eyes:     Conjunctiva/sclera: Conjunctivae normal.     Pupils: Pupils are equal, round, and reactive to light.  Neck:      Musculoskeletal: Normal range of motion and neck supple.     Thyroid: No thyromegaly.     Vascular: No carotid bruit or JVD.     Trachea: No tracheal deviation.  Cardiovascular:     Rate and Rhythm: Normal rate and regular rhythm.     Pulses: Normal pulses.     Heart sounds: Normal heart sounds. No murmur. No friction rub. No gallop.   Pulmonary:     Effort: Pulmonary effort is normal. No respiratory distress.     Breath sounds: Normal breath sounds. No wheezing or rales.  Chest:     Chest wall: No tenderness.  Abdominal:     General: Bowel sounds are normal.     Palpations: Abdomen is soft.     Tenderness: There is no abdominal tenderness.  Musculoskeletal: Normal range of motion.  Lymphadenopathy:     Cervical: No cervical adenopathy.  Skin:    General: Skin is warm and dry.     Capillary Refill: Capillary refill takes less than 2 seconds.  Neurological:     General: No focal deficit present.     Mental Status: She is alert and oriented to person, place, and time.     Cranial Nerves: No cranial nerve deficit.  Psychiatric:        Behavior: Behavior normal.        Thought Content: Thought content normal.        Judgment: Judgment normal.    Depression screen [facility] 2/9 07/04/2018 01/07/2018 06/06/2017  Decreased Interest 0 0 1  Down, Depressed, Hopeless 0 0 0  PHQ - 2 Score 0 0 1    Functional Status Survey: Is the patient deaf or have difficulty hearing?: Yes(sometimes, seems like people mumbles and listen to the tv louder) Does the patient have difficulty seeing, even when wearing glasses/contacts?: No Does the patient have difficulty concentrating, remembering, or making decisions?: No Does the patient have difficulty walking or climbing stairs?: No Does the patient have difficulty dressing or bathing?: No Does the patient have difficulty doing errands alone such as visiting a doctor's office or shopping?: No  MMSE - [place] Exam 07/04/2018  Orientation to time  5  Orientation to Place 5  Registration 3  Attention/ Calculation 5  Recall 3  Language- name 2 objects 2  Language- repeat 1  Language- follow 3 step command 3  Language- read & follow direction 1  Write a sentence 1  Copy design 1  Total score 30    Fall Risk  07/04/2018 07/04/2018 01/07/2018 06/06/2017  Falls in the past year? 0 0 No No      LABS: Recent Results (from the past 2160 hour(s))  Comprehensive metabolic panel     Status: Abnormal   Collection Time: 07/02/18 11:09 AM  Result Value Ref Range   Glucose 86 65 - 99 mg/dL   BUN 13 8 - 27 mg/dL   Creatinine, Ser 0.68 0.57 - 1.00 mg/dL   GFR calc non Af Amer 82 >59 mL/min/1.73   GFR calc Af Amer 94 >59 mL/min/1.73   BUN/Creatinine Ratio 19 12 - 28   Sodium 140 134 - 144 mmol/L   Potassium 4.2 3.5 - 5.2 mmol/L   Chloride 100  96 - 106 mmol/L   CO2 26 20 - 29 mmol/L   Calcium 9.2 8.7 - 10.3 mg/dL   Total Protein 6.5 6.0 - 8.5 g/dL   Albumin 4.5 3.5 - 4.7 g/dL    Comment:     **Effective July 14, 2018 Albumin reference**       interval will be changing to:              Age                Female          Female           0 -  7 days        3.6 - 4.9      3.6 - 4.9           8 - 30 days        3.4 - 4.7      3.4 - 4.7           1 -  6 month       3.7 - 4.8      3.7 - 4.8    7 months -  2 years       3.9 - 5.0      3.9 - 5.0           3 -  5 years       4.0 - 5.0      4.0 - 5.0           6 - 12 years       4.1 - 5.0      4.0 - 5.0          13 - 30 years       4.1 - 5.2      3.9 - 5.0          31 - 50 years       4.0 - 5.0      3.8 - 4.8          51 - 60 years       3.8 - 4.9      3.8 - 4.9          61 - 70 years       3.8 - 4.8      3.8 - 4.8          71 - 80 years       3.7 - 4.7      3.7 - 4.7          81 - 89 years       3.6 - 4.6      3.6 - 4.6              >89 years       3.5 - 4.6      3.5 - 4.6    Globulin, Total 2.0 1.5 - 4.5 g/dL   Albumin/Globulin Ratio 2.3 (H) 1.2 - 2.2   Bilirubin Total 0.7 0.0 - 1.2  mg/dL   Alkaline Phosphatase 44 39 - 117 IU/L   AST 22 0 - 40 IU/L   ALT 19 0 - 32 IU/L  CBC     Status: None   Collection Time: 07/02/18 11:09 AM  Result Value Ref Range   WBC 5.6 3.4 - 10.8 x10E3/uL   RBC 4.64 3.77 - 5.28 x10E6/uL   Hemoglobin 14.8 11.1 - 15.9 g/dL  Hematocrit 43.8 34.0 - 46.6 %   MCV 94 79 - 97 fL   MCH 31.9 26.6 - 33.0 pg   MCHC 33.8 31.5 - 35.7 g/dL   RDW 12.8 11.7 - 15.4 %    Comment:               **Please note reference interval change**   Platelets 180 150 - 450 x10E3/uL  Lipid Panel w/o Chol/HDL Ratio     Status: None   Collection Time: 07/02/18 11:09 AM  Result Value Ref Range   Cholesterol, Total 192 100 - 199 mg/dL   Triglycerides 63 0 - 149 mg/dL   HDL 96 >39 mg/dL   VLDL Cholesterol Cal 13 5 - 40 mg/dL   LDL Calculated 83 0 - 99 mg/dL  T4, free     Status: None   Collection Time: 07/02/18 11:09 AM  Result Value Ref Range   Free T4 1.64 0.82 - 1.77 ng/dL  TSH     Status: None   Collection Time: 07/02/18 11:09 AM  Result Value Ref Range   TSH 2.370 0.450 - 4.500 uIU/mL  VITAMIN D 25 Hydroxy (Vit-D Deficiency, Fractures)     Status: None   Collection Time: 07/02/18 11:09 AM  Result Value Ref Range   Vit D, 25-Hydroxy 46.9 30.0 - 100.0 ng/mL    Comment: Vitamin D deficiency has been defined by the [place] and an Endocrine Society practice guideline as a level of serum 25-OH vitamin D less than 20 ng/mL (1,2). The Endocrine Society went on to further define vitamin D insufficiency as a level between 21 and 29 ng/mL (2). 1. IOM (Institute of Medicine). 2010. Dietary reference    intakes for calcium and D. [place]: The    [employer]. 2. Holick MF, Binkley Wardner, Bischoff-Ferrari HA, et al.    Evaluation, treatment, and prevention of vitamin D    deficiency: an Endocrine Society clinical practice    guideline. JCEM. 2011 Jul; 96(7):1911-30.   T3     Status: None   Collection Time: 07/02/18 11:09 AM  Result  Value Ref Range   T3, Total 82 71 - 180 ng/dL  UA/M w/rflx Culture, Routine     Status: None   Collection Time: 07/04/18 12:30 PM  Result Value Ref Range   Specific Gravity, UA 1.007 1.005 - 1.030   pH, UA 7.0 5.0 - 7.5   Color, UA Yellow Yellow   Appearance Ur Clear Clear   Leukocytes, UA Negative Negative   Protein, UA Negative Negative/Trace   Glucose, UA Negative Negative   Ketones, UA Negative Negative   RBC, UA Negative Negative   Bilirubin, UA Negative Negative   Urobilinogen, Ur 0.2 0.2 - 1.0 mg/dL   Nitrite, UA Negative Negative   Microscopic Examination Comment     Comment: Microscopic follows if indicated.   Microscopic Examination See below:     Comment: Microscopic was indicated and was performed.   Urinalysis Reflex Comment     Comment: This specimen will not reflex to a Urine Culture.  Microscopic Examination     Status: None   Collection Time: 07/04/18 12:30 PM  Result Value Ref Range   WBC, UA None seen 0 - 5 /hpf   RBC, UA 0-2 0 - 2 /hpf   Epithelial Cells (non renal) None seen 0 - 10 /hpf   Casts None seen None seen /lpf   Mucus, UA Present Not Estab.   Bacteria, UA None  seen None seen/Few    Assessment/Plan: 1. Encounter for general adult medical examination with abnormal findings Annual health maintenance exam today.  2. Essential hypertension, benign Stable. Continue bp medication as prescribed   3. Age-related osteoporosis without current pathological fracture Continue boniva once monthly  4. Need for vaccination against Streptococcus pneumoniae using pneumococcal conjugate vaccine 13 rx for prevnar 13 sent to her pharmacy for administration.  - Pneumococcal conjugate vaccine 13-valent IM  5. Dysuria - UA/M w/rflx Culture, Routine  General Counseling: Mansi verbalizes understanding of the findings of todays visit and agrees with plan of treatment. I have discussed any further diagnostic evaluation that may be needed or ordered today. We also  reviewed her medications today. she has been encouraged to call the office with any questions or concerns that should arise related to todays visit.    Counseling:  Hypertension Counseling:   The following hypertensive lifestyle modification were recommended and discussed:  1. Limiting alcohol intake to less than 1 oz/day of ethanol:(24 oz of beer or 8 oz of wine or 2 oz of 100-proof whiskey). 2. Take baby ASA 81 mg daily. 3. Importance of regular aerobic exercise and losing weight. 4. Reduce dietary saturated fat and cholesterol intake for overall cardiovascular health. 5. Maintaining adequate dietary potassium, calcium, and magnesium intake. 6. Regular monitoring of the blood pressure. 7. Reduce sodium intake to less than 100 mmol/day (less than 2.3 gm of sodium or less than 6 gm of sodium choride)   This patient was seen by [place] with Dr [name] as a part of collaborative care agreement  Orders Placed This Encounter  Procedures  . Microscopic Examination  . Pneumococcal conjugate vaccine 13-valent IM  . UA/M w/rflx Culture, Routine      Time spent: [place], MD  Internal Medicine

## 2018-07-05 LAB — UA/M W/RFLX CULTURE, ROUTINE
Bilirubin, UA: NEGATIVE
Glucose, UA: NEGATIVE
KETONES UA: NEGATIVE
Leukocytes, UA: NEGATIVE
Nitrite, UA: NEGATIVE
Protein, UA: NEGATIVE
RBC, UA: NEGATIVE
Specific Gravity, UA: 1.007 (ref 1.005–1.030)
UUROB: 0.2 mg/dL (ref 0.2–1.0)
pH, UA: 7 (ref 5.0–7.5)

## 2018-07-05 LAB — MICROSCOPIC EXAMINATION
Bacteria, UA: NONE SEEN
Casts: NONE SEEN /lpf
Epithelial Cells (non renal): NONE SEEN /hpf (ref 0–10)
WBC, UA: NONE SEEN /hpf (ref 0–5)

## 2018-07-09 DIAGNOSIS — Z1239 Encounter for other screening for malignant neoplasm of breast: Secondary | ICD-10-CM | POA: Insufficient documentation

## 2018-07-09 DIAGNOSIS — R300 Dysuria: Secondary | ICD-10-CM | POA: Insufficient documentation

## 2018-07-09 DIAGNOSIS — Z0001 Encounter for general adult medical examination with abnormal findings: Principal | ICD-10-CM

## 2018-07-09 DIAGNOSIS — I10 Essential (primary) hypertension: Secondary | ICD-10-CM | POA: Insufficient documentation

## 2018-07-09 DIAGNOSIS — Z23 Encounter for immunization: Secondary | ICD-10-CM | POA: Insufficient documentation

## 2018-08-18 DIAGNOSIS — D2261 Melanocytic nevi of right upper limb, including shoulder: Secondary | ICD-10-CM | POA: Diagnosis not present

## 2018-08-18 DIAGNOSIS — D2272 Melanocytic nevi of left lower limb, including hip: Secondary | ICD-10-CM | POA: Diagnosis not present

## 2018-08-18 DIAGNOSIS — L821 Other seborrheic keratosis: Secondary | ICD-10-CM | POA: Diagnosis not present

## 2018-08-18 DIAGNOSIS — D225 Melanocytic nevi of trunk: Secondary | ICD-10-CM | POA: Diagnosis not present

## 2018-08-18 DIAGNOSIS — D2271 Melanocytic nevi of right lower limb, including hip: Secondary | ICD-10-CM | POA: Diagnosis not present

## 2018-08-18 DIAGNOSIS — D2262 Melanocytic nevi of left upper limb, including shoulder: Secondary | ICD-10-CM | POA: Diagnosis not present

## 2018-08-25 ENCOUNTER — Other Ambulatory Visit: Payer: Self-pay

## 2018-08-25 MED ORDER — FAMCICLOVIR 250 MG PO TABS: 250.0000 mg | ORAL_TABLET | Freq: Every day | ORAL | 0 refills | Status: DC

## 2018-08-26 ENCOUNTER — Other Ambulatory Visit: Payer: Self-pay

## 2018-09-10 ENCOUNTER — Other Ambulatory Visit: Payer: Self-pay | Admitting: Internal Medicine

## 2018-09-30 ENCOUNTER — Other Ambulatory Visit: Payer: Self-pay

## 2018-09-30 MED ORDER — CIPROFLOXACIN HCL 500 MG PO TABS: 500.0000 mg | ORAL_TABLET | Freq: Two times a day (BID) | ORAL | 0 refills | Status: AC

## 2018-09-30 NOTE — Telephone Encounter (Signed)
Pt called that she keep cipro on hold for uti as per dr Humphrey Rolls send cipro for 10 days

## 2018-10-14 DIAGNOSIS — R399 Unspecified symptoms and signs involving the genitourinary system: Secondary | ICD-10-CM | POA: Diagnosis not present

## 2018-10-14 DIAGNOSIS — R319 Hematuria, unspecified: Secondary | ICD-10-CM | POA: Diagnosis not present

## 2018-10-14 DIAGNOSIS — N390 Urinary tract infection, site not specified: Secondary | ICD-10-CM | POA: Diagnosis not present

## 2018-11-26 ENCOUNTER — Encounter: Payer: Self-pay | Admitting: Nurse Practitioner

## 2018-11-26 ENCOUNTER — Ambulatory Visit (INDEPENDENT_AMBULATORY_CARE_PROVIDER_SITE_OTHER): Payer: Medicare HMO | Admitting: Nurse Practitioner

## 2018-11-26 ENCOUNTER — Other Ambulatory Visit: Payer: Self-pay

## 2018-11-26 ENCOUNTER — Other Ambulatory Visit: Payer: Self-pay | Admitting: Nurse Practitioner

## 2018-11-26 VITALS — BP 149/78 | HR 78 | Temp 97.8°F | Resp 16 | Ht 60.0 in | Wt 139.0 lb

## 2018-11-26 DIAGNOSIS — R300 Dysuria: Secondary | ICD-10-CM

## 2018-11-26 DIAGNOSIS — N3001 Acute cystitis with hematuria: Secondary | ICD-10-CM

## 2018-11-26 DIAGNOSIS — N390 Urinary tract infection, site not specified: Secondary | ICD-10-CM | POA: Diagnosis not present

## 2018-11-26 DIAGNOSIS — R319 Hematuria, unspecified: Secondary | ICD-10-CM | POA: Diagnosis not present

## 2018-11-26 LAB — POCT URINALYSIS DIPSTICK
Bilirubin, UA: NEGATIVE
Glucose, UA: NEGATIVE
Ketones, UA: NEGATIVE
Leukocytes, UA: NEGATIVE
Nitrite, UA: NEGATIVE
Protein, UA: NEGATIVE
Spec Grav, UA: <=1.005 — AB (ref 1.010–1.025)
Urobilinogen, UA: 0.2 E.U./dL
pH, UA: 6 (ref 5.0–8.0)

## 2018-11-26 MED ORDER — METHENAMINE HIPPURATE 1 G PO TABS: 1.0000 g | ORAL_TABLET | Freq: Two times a day (BID) | ORAL | 0 refills | Status: DC

## 2018-11-26 MED ORDER — PHENAZOPYRIDINE HCL 200 MG PO TABS: 200.0000 mg | ORAL_TABLET | Freq: Three times a day (TID) | ORAL | 0 refills | Status: DC | PRN

## 2018-11-26 MED ORDER — NITROFURANTOIN MONOHYD MACRO 100 MG PO CAPS: 100.0000 mg | ORAL_CAPSULE | Freq: Two times a day (BID) | ORAL | 0 refills | Status: DC

## 2018-11-26 NOTE — Progress Notes (Signed)
[facility] [place],  [address]  Internal MEDICINE  Office Visit Note  Patient Name: Samantha Dennis  [id number]  [id number]  Date of Service: 11/26/2018   Pt is here for a sick visit.  Chief Complaint  Patient presents with  . Medical Management of Chronic Issues  . Urinary Tract Infection     The patient states that she has been having left sided flank pain for last few days. Started a few weeks ago. She did go to urgent care and was diagnosed with UTI. Was started on cipro and took full 10 days worth of the antibiotic she states that she did get mostly better. Symptoms started to reappear about 5 days after she finished the antibiotics. Isn't sure if she was on the wrong antibiotic or if she just wasn't on them long enough. She denies abdominal pain, nausea, vomiting, or diarrhea. She denies fever.        Current Medication:  Outpatient Encounter Medications as of 11/26/2018  Medication Sig Note  . alendronate (FOSAMAX) 70 MG tablet  06/06/2016: Received from: External Pharmacy  . aspirin EC 81 MG tablet Take by mouth. 06/06/2016: Received from: [facility]: Take 81 mg by mouth once daily.  . famciclovir (FAMVIR) 250 MG tablet TAKE 1 TABLET (250 MG TOTAL) BY MOUTH DAILY.   Marland Kitchen ibandronate (BONIVA) 150 MG tablet TAKE 1 TABLET EVERY MONTH ON AN EMPTY STOMACH   . levothyroxine (SYNTHROID, LEVOTHROID) 75 MCG tablet TAKE 1 TABLET (75 MCG TOTAL) BY MOUTH DAILY BEFORE BREAKFAST.   Marland Kitchen lisinopril-hydrochlorothiazide (PRINZIDE,ZESTORETIC) 10-12.5 MG tablet TAKE 1 TABLET EVERY DAY   . rosuvastatin (CRESTOR) 5 MG tablet Take 1 tablet (5 mg total) by mouth daily.   . [DISCONTINUED] methenamine (HIPREX) 1 g tablet Take 1 g by mouth 2 (two) times daily with a meal.   . nitrofurantoin, macrocrystal-monohydrate, (MACROBID) 100 MG capsule Take 1 capsule (100 mg total) by mouth 2 (two) times daily.   . [DISCONTINUED] phenazopyridine  (PYRIDIUM) 200 MG tablet Take 1 tablet (200 mg total) by mouth 3 (three) times daily as needed for pain. 11/26/2018: not on preferred list    No facility-administered encounter medications on file as of 11/26/2018.       Medical History: Past Medical History:  Diagnosis Date  . Hay fever   . Hypertension   . Hypothyroidism   . Osteoporosis      Vital Signs: BP (!) 149/78   Pulse 78   Temp 97.8 F (36.6 C)   Resp 16   Ht 5' (1.524 m)   Wt 139 lb (63 kg)   SpO2 97%   BMI 27.15 kg/m    Review of Systems  Constitutional: Negative for activity change, chills, fatigue and unexpected weight change.  HENT: Negative for congestion, postnasal drip, rhinorrhea, sneezing and sore throat.   Respiratory: Negative for cough, chest tightness, shortness of breath and wheezing.   Cardiovascular: Negative for chest pain and palpitations.  Gastrointestinal: Negative for abdominal pain, constipation, diarrhea, nausea and vomiting.  Endocrine: Negative for cold intolerance, heat intolerance, polydipsia and polyuria.  Genitourinary: Positive for flank pain and hematuria. Negative for dysuria, frequency and urgency.  Musculoskeletal: Negative for arthralgias, back pain, joint swelling and neck pain.  Skin: Negative for rash.  Allergic/Immunologic: Negative for environmental allergies.  Neurological: Negative for dizziness, tremors, numbness and headaches.  Hematological: Negative for adenopathy. Does not bruise/bleed easily.  Psychiatric/Behavioral: Negative for behavioral problems (Depression), sleep disturbance and  suicidal ideas. The patient is not nervous/anxious.     Physical Exam Vitals signs and nursing note reviewed.  Constitutional:      General: She is not in acute distress.    Appearance: Normal appearance. She is well-developed. She is not diaphoretic.  HENT:     Head: Normocephalic and atraumatic.     Mouth/Throat:     Pharynx: No oropharyngeal exudate.  Eyes:      Conjunctiva/sclera: Conjunctivae normal.     Pupils: Pupils are equal, round, and reactive to light.  Neck:     Musculoskeletal: Normal range of motion and neck supple.     Thyroid: No thyromegaly.     Vascular: No carotid bruit or JVD.     Trachea: No tracheal deviation.  Cardiovascular:     Rate and Rhythm: Normal rate and regular rhythm.     Pulses: Normal pulses.     Heart sounds: Normal heart sounds. No murmur. No friction rub. No gallop.   Pulmonary:     Effort: Pulmonary effort is normal. No respiratory distress.     Breath sounds: Normal breath sounds. No wheezing or rales.  Chest:     Chest wall: No tenderness.  Abdominal:     General: Bowel sounds are normal.     Palpations: Abdomen is soft.     Tenderness: There is no abdominal tenderness.  Genitourinary:    Comments: Urine sample positive for trace blood. Musculoskeletal: Normal range of motion.  Lymphadenopathy:     Cervical: No cervical adenopathy.  Skin:    General: Skin is warm and dry.     Capillary Refill: Capillary refill takes less than 2 seconds.  Neurological:     General: No focal deficit present.     Mental Status: She is alert and oriented to person, place, and time.     Cranial Nerves: No cranial nerve deficit.  Psychiatric:        Behavior: Behavior normal.        Thought Content: Thought content normal.        Judgment: Judgment normal.   Assessment/Plan:  1. Urinary tract infection with hematuria, site unspecified Start macrobid 100mg  twice daily for 10 days. Send urine for culture and sensitivity and adjust antibiotics as indicated.  - nitrofurantoin, macrocrystal-monohydrate, (MACROBID) 100 MG capsule; Take 1 capsule (100 mg total) by mouth 2 (two) times daily.  Dispense: 20 capsule; Refill: 0  2. Dysuria Pyridium may be taken up to three times daily if needed for bladder pain and spasms.  - POCT Urinalysis Dipstick  3. Acute cystitis with hematuria - CULTURE, URINE  COMPREHENSIVE  General Counseling: Labrenda verbalizes understanding of the findings of todays visit and agrees with plan of treatment. I have discussed any further diagnostic evaluation that may be needed or ordered today. We also reviewed her medications today. she has been encouraged to call the office with any questions or concerns that should arise related to todays visit.    Counseling:   This patient was seen by Leretha Pol FNP Collaboration with Dr [name] as a part of collaborative care agreement  Orders Placed This Encounter  Procedures  . CULTURE, URINE COMPREHENSIVE  . POCT Urinalysis Dipstick    Meds ordered this encounter  Medications  . nitrofurantoin, macrocrystal-monohydrate, (MACROBID) 100 MG capsule    Sig: Take 1 capsule (100 mg total) by mouth 2 (two) times daily.    Dispense:  20 capsule    Refill:  0  Order Specific Question:   Supervising Provider    Answer:   [name] [[id number]]  . DISCONTD: phenazopyridine (PYRIDIUM) 200 MG tablet    Sig: Take 1 tablet (200 mg total) by mouth 3 (three) times daily as needed for pain.    Dispense:  10 tablet    Refill:  0    Order Specific Question:   Supervising Provider    Answer:   [name] [[id number]]    Time spent: 25 Minutes

## 2018-11-29 LAB — CULTURE, URINE COMPREHENSIVE

## 2018-12-09 ENCOUNTER — Encounter: Payer: Self-pay | Admitting: Nurse Practitioner

## 2018-12-09 ENCOUNTER — Other Ambulatory Visit: Payer: Self-pay

## 2018-12-09 ENCOUNTER — Ambulatory Visit (INDEPENDENT_AMBULATORY_CARE_PROVIDER_SITE_OTHER): Payer: Medicare HMO | Admitting: Nurse Practitioner

## 2018-12-09 VITALS — BP 132/70 | HR 63 | Resp 16 | Ht 60.0 in | Wt 141.0 lb

## 2018-12-09 DIAGNOSIS — N390 Urinary tract infection, site not specified: Secondary | ICD-10-CM

## 2018-12-09 DIAGNOSIS — R300 Dysuria: Secondary | ICD-10-CM | POA: Diagnosis not present

## 2018-12-09 DIAGNOSIS — I10 Essential (primary) hypertension: Secondary | ICD-10-CM | POA: Diagnosis not present

## 2018-12-09 DIAGNOSIS — R319 Hematuria, unspecified: Secondary | ICD-10-CM

## 2018-12-09 LAB — POCT URINALYSIS DIPSTICK
Bilirubin, UA: NEGATIVE
Glucose, UA: NEGATIVE
Ketones, UA: NEGATIVE
Leukocytes, UA: NEGATIVE
Nitrite, UA: NEGATIVE
Protein, UA: NEGATIVE
Spec Grav, UA: 1.01 (ref 1.010–1.025)
Urobilinogen, UA: 0.2 E.U./dL
pH, UA: 5 (ref 5.0–8.0)

## 2018-12-09 MED ORDER — NITROFURANTOIN MONOHYD MACRO 100 MG PO CAPS: 100.0000 mg | ORAL_CAPSULE | Freq: Two times a day (BID) | ORAL | 1 refills | Status: DC

## 2018-12-09 NOTE — Progress Notes (Signed)
[facility] [place], [place] [address]  Internal MEDICINE  Office Visit Note  Patient Name: Samantha Dennis  [id number]  [id number]  Date of Service: 12/09/2018  Chief Complaint  Patient presents with  . Medical Management of Chronic Issues     2 week follow up uti , feeling much better     The patent has completed second round of antibiotics for uti. First round was cipro. Just completed round macrobid 100mg  for 10 days. She states that she feels much better. Urine culture indicated norma urogenital flora. There is still a trace of blood in the urine.       Current Medication: Outpatient Encounter Medications as of 12/09/2018  Medication Sig Note  . alendronate (FOSAMAX) 70 MG tablet  06/06/2016: Received from: External Pharmacy  . aspirin EC 81 MG tablet Take by mouth. 06/06/2016: Received from: [place]: Take 81 mg by mouth once daily.  . famciclovir (FAMVIR) 250 MG tablet TAKE 1 TABLET (250 MG TOTAL) BY MOUTH DAILY.   Marland Kitchen ibandronate (BONIVA) 150 MG tablet TAKE 1 TABLET EVERY MONTH ON AN EMPTY STOMACH   . levothyroxine (SYNTHROID, LEVOTHROID) 75 MCG tablet TAKE 1 TABLET (75 MCG TOTAL) BY MOUTH DAILY BEFORE BREAKFAST.   Marland Kitchen lisinopril-hydrochlorothiazide (PRINZIDE,ZESTORETIC) 10-12.5 MG tablet TAKE 1 TABLET EVERY DAY   . methenamine (HIPREX) 1 g tablet Take 1 tablet (1 g total) by mouth 2 (two) times daily with a meal.   . nitrofurantoin, macrocrystal-monohydrate, (MACROBID) 100 MG capsule Take 1 capsule (100 mg total) by mouth 2 (two) times daily.   . rosuvastatin (CRESTOR) 5 MG tablet Take 1 tablet (5 mg total) by mouth daily.   . [DISCONTINUED] nitrofurantoin, macrocrystal-monohydrate, (MACROBID) 100 MG capsule Take 1 capsule (100 mg total) by mouth 2 (two) times daily. (Patient not taking: Reported on 12/09/2018)    No facility-administered encounter medications on file as of 12/09/2018.     Surgical History: Past  Surgical History:  Procedure Laterality Date  . TOTAL ABDOMINAL HYSTERECTOMY Bilateral 1984    Medical History: Past Medical History:  Diagnosis Date  . Hay fever   . Hypertension   . Hypothyroidism   . Osteoporosis     Family History: Family History  Problem Relation Age of Onset  . Breast cancer Neg Hx     Social History   Socioeconomic History  . Marital status: Married    Spouse name: Not on file  . Number of children: Not on file  . Years of education: Not on file  . Highest education level: Not on file  Occupational History  . Not on file  Social Needs  . Financial resource strain: Not on file  . Food insecurity    Worry: Not on file    Inability: Not on file  . Transportation needs    Medical: Not on file    Non-medical: Not on file  Tobacco Use  . Smoking status: Never Smoker  . Smokeless tobacco: Never Used  Substance and Sexual Activity  . Alcohol use: Yes    Alcohol/week: 1.0 standard drinks    Types: 1 Glasses of wine per week  . Drug use: No  . Sexual activity: Not on file  Lifestyle  . Physical activity    Days per week: Not on file    Minutes per session: Not on file  . Stress: Not on file  Relationships  . Social connections    Talks on phone: Not on file  Gets together: Not on file    Attends religious service: Not on file    Active member of club or organization: Not on file    Attends meetings of clubs or organizations: Not on file    Relationship status: Not on file  . Intimate partner violence    Fear of current or ex partner: Not on file    Emotionally abused: Not on file    Physically abused: Not on file    Forced sexual activity: Not on file  Other Topics Concern  . Not on file  Social History Narrative  . Not on file      Review of Systems  Constitutional: Negative for activity change, chills, fatigue and unexpected weight change.  HENT: Negative for congestion, postnasal drip, rhinorrhea, sneezing and sore throat.    Respiratory: Negative for cough, chest tightness, shortness of breath and wheezing.   Cardiovascular: Negative for chest pain and palpitations.  Gastrointestinal: Negative for abdominal pain, constipation, diarrhea, nausea and vomiting.  Endocrine: Negative for cold intolerance, heat intolerance, polydipsia and polyuria.  Genitourinary: Negative for dysuria, flank pain, frequency, hematuria and urgency.  Musculoskeletal: Negative for arthralgias, back pain, joint swelling and neck pain.  Skin: Negative for rash.  Allergic/Immunologic: Negative for environmental allergies.  Neurological: Negative for dizziness, tremors, numbness and headaches.  Hematological: Negative for adenopathy. Does not bruise/bleed easily.  Psychiatric/Behavioral: Negative for behavioral problems (Depression), sleep disturbance and suicidal ideas. The patient is not nervous/anxious.    Today's Vitals   12/09/18 1435  BP: 132/70  Pulse: 63  Resp: 16  SpO2: 99%  Weight: 141 lb (64 kg)  Height: 5' (1.524 m)   Body mass index is 27.54 kg/m.  Physical Exam Vitals signs and nursing note reviewed.  Constitutional:      General: She is not in acute distress.    Appearance: Normal appearance. She is well-developed. She is not diaphoretic.  HENT:     Head: Normocephalic and atraumatic.     Mouth/Throat:     Pharynx: No oropharyngeal exudate.  Eyes:     Conjunctiva/sclera: Conjunctivae normal.     Pupils: Pupils are equal, round, and reactive to light.  Neck:     Musculoskeletal: Normal range of motion and neck supple.     Thyroid: No thyromegaly.     Vascular: No carotid bruit or JVD.     Trachea: No tracheal deviation.  Cardiovascular:     Rate and Rhythm: Normal rate and regular rhythm.     Heart sounds: Normal heart sounds. No murmur. No friction rub. No gallop.   Pulmonary:     Effort: Pulmonary effort is normal. No respiratory distress.     Breath sounds: Normal breath sounds. No wheezing or rales.   Chest:     Chest wall: No tenderness.  Abdominal:     General: Bowel sounds are normal.     Palpations: Abdomen is soft.     Tenderness: There is no abdominal tenderness.  Genitourinary:    Comments: Urine sample positive for trace blood. Musculoskeletal: Normal range of motion.  Lymphadenopathy:     Cervical: No cervical adenopathy.  Skin:    General: Skin is warm and dry.     Capillary Refill: Capillary refill takes less than 2 seconds.  Neurological:     General: No focal deficit present.     Mental Status: She is alert and oriented to person, place, and time.     Cranial Nerves: No cranial nerve deficit.  Psychiatric:  Behavior: Behavior normal.        Thought Content: Thought content normal.        Judgment: Judgment normal.    Assessment/Plan:  1. Urinary tract infection with hematuria, site unspecified Resolved, but has been recurrent in the past. Sent prescription for macrobid 100mg  twice daily to take if UTI develops again. Patient to contact office if this is ineffective  - nitrofurantoin, macrocrystal-monohydrate, (MACROBID) 100 MG capsule; Take 1 capsule (100 mg total) by mouth 2 (two) times daily.  Dispense: 20 capsule; Refill: 1  2. Dysuria - POCT Urinalysis Dipstick positive for trace blood only.   3. Essential hypertension, benign Stable. Continue bp medication as prescribed   General Counseling: Leyda verbalizes understanding of the findings of todays visit and agrees with plan of treatment. I have discussed any further diagnostic evaluation that may be needed or ordered today. We also reviewed her medications today. she has been encouraged to call the office with any questions or concerns that should arise related to todays visit.  This patient was seen by Leretha Pol FNP Collaboration with Dr [name] as a part of collaborative care agreement  Orders Placed This Encounter  Procedures  . POCT Urinalysis Dipstick    Meds ordered this  encounter  Medications  . nitrofurantoin, macrocrystal-monohydrate, (MACROBID) 100 MG capsule    Sig: Take 1 capsule (100 mg total) by mouth 2 (two) times daily.    Dispense:  20 capsule    Refill:  1    Order Specific Question:   Supervising Provider    Answer:   [name] [[id number]]    Time spent: 68 Minutes      Dr [name] Internal medicine

## 2019-01-23 ENCOUNTER — Other Ambulatory Visit: Payer: Self-pay

## 2019-01-23 ENCOUNTER — Encounter: Payer: Self-pay | Admitting: Nurse Practitioner

## 2019-01-23 ENCOUNTER — Ambulatory Visit (INDEPENDENT_AMBULATORY_CARE_PROVIDER_SITE_OTHER): Payer: Medicare HMO | Admitting: Nurse Practitioner

## 2019-01-23 VITALS — BP 138/70 | HR 72 | Resp 16 | Ht 61.0 in | Wt 139.2 lb

## 2019-01-23 DIAGNOSIS — E782 Mixed hyperlipidemia: Secondary | ICD-10-CM | POA: Diagnosis not present

## 2019-01-23 DIAGNOSIS — I10 Essential (primary) hypertension: Secondary | ICD-10-CM

## 2019-01-23 DIAGNOSIS — M810 Age-related osteoporosis without current pathological fracture: Secondary | ICD-10-CM

## 2019-01-23 DIAGNOSIS — Z1239 Encounter for other screening for malignant neoplasm of breast: Secondary | ICD-10-CM | POA: Diagnosis not present

## 2019-01-23 NOTE — Progress Notes (Signed)
[facility] [place], [place] [address]  Internal MEDICINE  Office Visit Note  Patient Name: Samantha Dennis  [id number]  [id number]  Date of Service: 01/23/2019  Chief Complaint  Patient presents with  . Medical Management of Chronic Issues  . Hypertension  . Osteoporosis  . Hypothyroidism    The patient is here for routine follow up. She states that she is doing well. She has no concerns or complaints. Blood pressure is well controlled. Labs, done in 06/2018, were good. She is due to have screening mammogram.       Current Medication: Outpatient Encounter Medications as of 01/23/2019  Medication Sig Note  . alendronate (FOSAMAX) 70 MG tablet 70 mg once a week.  06/06/2016: Received from: External Pharmacy  . levothyroxine (SYNTHROID, LEVOTHROID) 75 MCG tablet TAKE 1 TABLET (75 MCG TOTAL) BY MOUTH DAILY BEFORE BREAKFAST.   Marland Kitchen lisinopril-hydrochlorothiazide (PRINZIDE,ZESTORETIC) 10-12.5 MG tablet TAKE 1 TABLET EVERY DAY   . rosuvastatin (CRESTOR) 5 MG tablet Take 1 tablet (5 mg total) by mouth daily.   . [DISCONTINUED] aspirin EC 81 MG tablet Take by mouth. 06/06/2016: Received from: [place]: Take 81 mg by mouth once daily.  . [DISCONTINUED] famciclovir (FAMVIR) 250 MG tablet TAKE 1 TABLET (250 MG TOTAL) BY MOUTH DAILY. (Patient not taking: Reported on 01/23/2019)   . [DISCONTINUED] ibandronate (BONIVA) 150 MG tablet TAKE 1 TABLET EVERY MONTH ON AN EMPTY STOMACH (Patient not taking: Reported on 01/23/2019)   . [DISCONTINUED] methenamine (HIPREX) 1 g tablet Take 1 tablet (1 g total) by mouth 2 (two) times daily with a meal. (Patient not taking: Reported on 01/23/2019)   . [DISCONTINUED] nitrofurantoin, macrocrystal-monohydrate, (MACROBID) 100 MG capsule Take 1 capsule (100 mg total) by mouth 2 (two) times daily. (Patient not taking: Reported on 01/23/2019)    No facility-administered encounter medications on file as of 01/23/2019.      Surgical History: Past Surgical History:  Procedure Laterality Date  . TOTAL ABDOMINAL HYSTERECTOMY Bilateral 1984    Medical History: Past Medical History:  Diagnosis Date  . Hay fever   . Hypertension   . Hypothyroidism   . Osteoporosis     Family History: Family History  Problem Relation Age of Onset  . Breast cancer Neg Hx     Social History   Socioeconomic History  . Marital status: Married    Spouse name: Not on file  . Number of children: Not on file  . Years of education: Not on file  . Highest education level: Not on file  Occupational History  . Not on file  Social Needs  . Financial resource strain: Not on file  . Food insecurity    Worry: Not on file    Inability: Not on file  . Transportation needs    Medical: Not on file    Non-medical: Not on file  Tobacco Use  . Smoking status: Never Smoker  . Smokeless tobacco: Never Used  Substance and Sexual Activity  . Alcohol use: Yes    Alcohol/week: 1.0 standard drinks    Types: 1 Glasses of wine per week  . Drug use: No  . Sexual activity: Not on file  Lifestyle  . Physical activity    Days per week: Not on file    Minutes per session: Not on file  . Stress: Not on file  Relationships  . Social Herbalist on phone: Not on file    Gets together:  Not on file    Attends religious service: Not on file    Active member of club or organization: Not on file    Attends meetings of clubs or organizations: Not on file    Relationship status: Not on file  . Intimate partner violence    Fear of current or ex partner: Not on file    Emotionally abused: Not on file    Physically abused: Not on file    Forced sexual activity: Not on file  Other Topics Concern  . Not on file  Social History Narrative  . Not on file      Review of Systems  Constitutional: Negative for activity change, chills, fatigue and unexpected weight change.  HENT: Negative for congestion, postnasal drip,  rhinorrhea, sneezing and sore throat.   Respiratory: Negative for cough, chest tightness, shortness of breath and wheezing.   Cardiovascular: Negative for chest pain and palpitations.  Gastrointestinal: Negative for abdominal pain, constipation, diarrhea, nausea and vomiting.  Endocrine: Negative for cold intolerance, heat intolerance, polydipsia and polyuria.  Genitourinary: Negative for dysuria, flank pain, frequency, hematuria and urgency.  Musculoskeletal: Negative for arthralgias, back pain, joint swelling and neck pain.  Skin: Negative for rash.  Allergic/Immunologic: Negative for environmental allergies.  Neurological: Negative for dizziness, tremors, numbness and headaches.  Hematological: Negative for adenopathy. Does not bruise/bleed easily.  Psychiatric/Behavioral: Negative for behavioral problems (Depression), sleep disturbance and suicidal ideas. The patient is not nervous/anxious.     Today's Vitals   01/23/19 1048  BP: 138/70  Pulse: 72  Resp: 16  SpO2: 95%  Weight: 139 lb 3.2 oz (63.1 kg)  Height: 5\' 1"  (1.549 m)   Body mass index is 26.3 kg/m.  Physical Exam Vitals signs and nursing note reviewed.  Constitutional:      General: She is not in acute distress.    Appearance: Normal appearance. She is well-developed. She is not diaphoretic.  HENT:     Head: Normocephalic and atraumatic.     Mouth/Throat:     Pharynx: No oropharyngeal exudate.  Eyes:     Conjunctiva/sclera: Conjunctivae normal.     Pupils: Pupils are equal, round, and reactive to light.  Neck:     Musculoskeletal: Normal range of motion and neck supple.     Thyroid: No thyromegaly.     Vascular: No carotid bruit or JVD.     Trachea: No tracheal deviation.  Cardiovascular:     Rate and Rhythm: Normal rate and regular rhythm.     Heart sounds: Normal heart sounds. No murmur. No friction rub. No gallop.   Pulmonary:     Effort: Pulmonary effort is normal. No respiratory distress.     Breath  sounds: Normal breath sounds. No wheezing or rales.  Chest:     Chest wall: No tenderness.  Abdominal:     General: Bowel sounds are normal.     Palpations: Abdomen is soft.     Tenderness: There is no abdominal tenderness.  Musculoskeletal: Normal range of motion.  Lymphadenopathy:     Cervical: No cervical adenopathy.  Skin:    General: Skin is warm and dry.     Capillary Refill: Capillary refill takes less than 2 seconds.  Neurological:     General: No focal deficit present.     Mental Status: She is alert and oriented to person, place, and time.     Cranial Nerves: No cranial nerve deficit.  Psychiatric:        Behavior: Behavior  normal.        Thought Content: Thought content normal.        Judgment: Judgment normal.    Assessment/Plan: 1. Essential hypertension, benign Stable. Continue bp medication as prescribed   2. Age-related osteoporosis without current pathological fracture Continue foxamax as prescribed   3. Mixed hyperlipidemia crestor as prescribed   4. Screening for breast cancer - MM DIGITAL SCREENING BILATERAL; Future  General Counseling: genea rheaume understanding of the findings of todays visit and agrees with plan of treatment. I have discussed any further diagnostic evaluation that may be needed or ordered today. We also reviewed her medications today. she has been encouraged to call the office with any questions or concerns that should arise related to todays visit.  Hypertension Counseling:   The following hypertensive lifestyle modification were recommended and discussed:  1. Limiting alcohol intake to less than 1 oz/day of ethanol:(24 oz of beer or 8 oz of wine or 2 oz of 100-proof whiskey). 2. Take baby ASA 81 mg daily. 3. Importance of regular aerobic exercise and losing weight. 4. Reduce dietary saturated fat and cholesterol intake for overall cardiovascular health. 5. Maintaining adequate dietary potassium, calcium, and magnesium  intake. 6. Regular monitoring of the blood pressure. 7. Reduce sodium intake to less than 100 mmol/day (less than 2.3 gm of sodium or less than 6 gm of sodium choride)   This patient was seen by Clarita with Dr [name] as a part of collaborative care agreement  Orders Placed This Encounter  Procedures  . MM DIGITAL SCREENING BILATERAL     Time spent: 25 Minutes      Dr [name] Internal medicine

## 2019-01-26 ENCOUNTER — Ambulatory Visit: Payer: Self-pay | Admitting: Nurse Practitioner

## 2019-02-25 ENCOUNTER — Other Ambulatory Visit: Payer: Self-pay | Admitting: Internal Medicine

## 2019-03-09 ENCOUNTER — Other Ambulatory Visit: Payer: Self-pay | Admitting: Internal Medicine

## 2019-03-12 ENCOUNTER — Other Ambulatory Visit: Payer: Self-pay

## 2019-03-12 MED ORDER — LISINOPRIL-HYDROCHLOROTHIAZIDE 10-12.5 MG PO TABS: 1.0000 | ORAL_TABLET | Freq: Every day | ORAL | 1 refills | Status: DC

## 2019-03-17 ENCOUNTER — Other Ambulatory Visit: Payer: Self-pay

## 2019-03-17 MED ORDER — FAMCICLOVIR 250 MG PO TABS: 250.0000 mg | ORAL_TABLET | Freq: Every day | ORAL | 1 refills | Status: DC

## 2019-06-29 ENCOUNTER — Other Ambulatory Visit: Payer: Self-pay

## 2019-06-29 ENCOUNTER — Ambulatory Visit (INDEPENDENT_AMBULATORY_CARE_PROVIDER_SITE_OTHER): Payer: Medicare HMO | Admitting: Nurse Practitioner

## 2019-06-29 ENCOUNTER — Encounter: Payer: Self-pay | Admitting: Nurse Practitioner

## 2019-06-29 VITALS — BP 151/72 | HR 82 | Temp 97.6°F | Resp 16 | Ht 61.0 in | Wt 140.2 lb

## 2019-06-29 DIAGNOSIS — I10 Essential (primary) hypertension: Secondary | ICD-10-CM | POA: Diagnosis not present

## 2019-06-29 DIAGNOSIS — M25561 Pain in right knee: Secondary | ICD-10-CM | POA: Diagnosis not present

## 2019-06-29 DIAGNOSIS — M1711 Unilateral primary osteoarthritis, right knee: Secondary | ICD-10-CM | POA: Diagnosis not present

## 2019-06-29 NOTE — Progress Notes (Signed)
[facility] [place], [place] [address]  Internal MEDICINE  Office Visit Note  Patient Name: Samantha Dennis  [id number]  [id number]  Date of Service: 07/01/2019  Chief Complaint  Patient presents with  . Knee Pain    right knee pain, has been going on for 2 months and does not feel better, pain is more prominent in the morning   . Hip Pain    started with hip pain, hurts to lay    Samantha Dennis presents today with complaints of R lateral leg pain at the knee. She reports that the pain started in her right hip around August and has progressively moved down her leg. She reports that the pain occurs with weight-bearing, and is worse in the morning when she gets up, or after prolonged sitting. She states that the pain comes and goes very quickly, but it is "excruciating" when it does occur. She is unable to describe the pain, but denies any radiation of the pain. She also denies any numbness, tingling, or weakness. She has tried taking ibuprofen, tylenol, and tramadol without relief. She denies any recent trauma, any lumps, or any lesions in the affected area. She also reports some bilateral pain in her hips at night, but states that this pain is far less severe.      Current Medication: Outpatient Encounter Medications as of 06/29/2019  Medication Sig Note  . alendronate (FOSAMAX) 70 MG tablet 70 mg once a week.  06/06/2016: Received from: External Pharmacy  . famciclovir (FAMVIR) 250 MG tablet Take 1 tablet (250 mg total) by mouth daily.   Marland Kitchen ibandronate (BONIVA) 150 MG tablet TAKE 1 TABLET EVERY MONTH ON AN EMPTY STOMACH   . levothyroxine (SYNTHROID, LEVOTHROID) 75 MCG tablet TAKE 1 TABLET (75 MCG TOTAL) BY MOUTH DAILY BEFORE BREAKFAST.   Marland Kitchen lisinopril-hydrochlorothiazide (ZESTORETIC) 10-12.5 MG tablet Take 1 tablet by mouth daily.   . rosuvastatin (CRESTOR) 5 MG tablet TAKE 1 TABLET EVERY DAY    No facility-administered encounter medications on file as of 06/29/2019.     Surgical History: Past Surgical History:  Procedure Laterality Date  . TOTAL ABDOMINAL HYSTERECTOMY Bilateral 1984    Medical History: Past Medical History:  Diagnosis Date  . Hay fever   . Hypertension   . Hypothyroidism   . Osteoporosis     Family History: Family History  Problem Relation Age of Onset  . Breast cancer Neg Hx     Social History   Socioeconomic History  . Marital status: Married    Spouse name: Not on file  . Number of children: Not on file  . Years of education: Not on file  . Highest education level: Not on file  Occupational History  . Not on file  Tobacco Use  . Smoking status: Never Smoker  . Smokeless tobacco: Never Used  Substance and Sexual Activity  . Alcohol use: Yes    Alcohol/week: 1.0 standard drinks    Types: 1 Glasses of wine per week    Comment: ocassionally   . Drug use: No  . Sexual activity: Not on file  Other Topics Concern  . Not on file  Social History Narrative  . Not on file   Social Determinants of Health   Financial Resource Strain:   . Difficulty of Paying Living Expenses: Not on file  Food Insecurity:   . Worried About Charity fundraiser in the Last Year: Not on file  . Ran Out of Food in the  Last Year: Not on file  Transportation Needs:   . Lack of Transportation (Medical): Not on file  . Lack of Transportation (Non-Medical): Not on file  Physical Activity:   . Days of Exercise per Week: Not on file  . Minutes of Exercise per Session: Not on file  Stress:   . Feeling of Stress : Not on file  Social Connections:   . Frequency of Communication with Friends and Family: Not on file  . Frequency of Social Gatherings with Friends and Family: Not on file  . Attends Religious Services: Not on file  . Active Member of Clubs or Organizations: Not on file  . Attends Archivist Meetings: Not on file  . Marital Status: Not on file  Intimate Partner Violence:   . Fear of Current or Ex-Partner: Not on  file  . Emotionally Abused: Not on file  . Physically Abused: Not on file  . Sexually Abused: Not on file      Review of Systems  Constitutional: Negative for activity change, chills, fatigue and unexpected weight change.  HENT: Negative for congestion, postnasal drip, rhinorrhea, sneezing and sore throat.   Respiratory: Negative for cough, chest tightness, shortness of breath and wheezing.   Cardiovascular: Negative for chest pain and palpitations.  Gastrointestinal: Negative for abdominal pain, constipation, diarrhea, nausea and vomiting.  Genitourinary: Negative for dysuria and frequency.  Musculoskeletal: Positive for arthralgias and myalgias. Negative for back pain, joint swelling and neck pain.       Right lateral knee pain. Severe when she initially changes position from seated to standing, or after she has been stationary standing for several minutes then goes to walk. Pain gets some better after she is moving around.   Skin: Negative for rash.  Allergic/Immunologic: Negative for environmental allergies.  Neurological: Negative for dizziness, tremors, numbness and headaches.  Hematological: Negative for adenopathy. Does not bruise/bleed easily.  Psychiatric/Behavioral: Negative for behavioral problems (Depression), sleep disturbance and suicidal ideas. The patient is not nervous/anxious.    Today's Vitals   06/29/19 1418  BP: (!) 151/72  Pulse: 82  Resp: 16  Temp: 97.6 F (36.4 C)  SpO2: 98%  Weight: 140 lb 3.2 oz (63.6 kg)  Height: 5\' 1"  (1.549 m)   Body mass index is 26.49 kg/m.  Physical Exam Vitals and nursing note reviewed.  Constitutional:      General: She is not in acute distress.    Appearance: Normal appearance. She is well-developed. She is not diaphoretic.  HENT:     Head: Normocephalic and atraumatic.     Nose: Nose normal.     Mouth/Throat:     Pharynx: No oropharyngeal exudate.  Eyes:     Pupils: Pupils are equal, round, and reactive to light.    Neck:     Thyroid: No thyromegaly.     Vascular: No JVD.     Trachea: No tracheal deviation.  Cardiovascular:     Rate and Rhythm: Normal rate and regular rhythm.     Heart sounds: Normal heart sounds. No murmur. No friction rub. No gallop.   Pulmonary:     Effort: Pulmonary effort is normal. No respiratory distress.     Breath sounds: Normal breath sounds. No wheezing or rales.  Chest:     Chest wall: No tenderness.  Abdominal:     Palpations: Abdomen is soft.  Musculoskeletal:        General: Normal range of motion.     Cervical back: Normal range of  motion and neck supple.     Right knee: No swelling, deformity, erythema or ecchymosis. Normal range of motion.     Left knee: No swelling, deformity, erythema or ecchymosis. Normal range of motion.       Legs:     Comments: There is moderate to severe pain along the lateral side of the right knee. Mild swelling present. ROM and strength are mildly reduced due to pain.   Lymphadenopathy:     Cervical: No cervical adenopathy.  Skin:    General: Skin is warm and dry.  Neurological:     Mental Status: She is alert and oriented to person, place, and time.     Cranial Nerves: No cranial nerve deficit.  Psychiatric:        Mood and Affect: Mood normal.        Behavior: Behavior normal.        Thought Content: Thought content normal.        Judgment: Judgment normal.    Assessment/Plan: 1. Acute pain of right knee Apply a compressive ACE bandage. Rest and elevate the affected painful area.  Apply cold compresses intermittently as needed.  As pain recedes, begin normal activities slowly as tolerated.  Will get x-ray of right knee for further evaluation. Refer to orthopedics.  - DG Knee Complete 4 Views Right; Future - Ambulatory referral to Orthopedic Surgery  2. Essential hypertension, benign Generally stable. Continue bp medication as prescribed.   General Counseling: meta pugliese understanding of the findings of todays  visit and agrees with plan of treatment. I have discussed any further diagnostic evaluation that may be needed or ordered today. We also reviewed her medications today. she has been encouraged to call the office with any questions or concerns that should arise related to todays visit.  This patient was seen by Leretha Pol FNP Collaboration with Dr [name] as a part of collaborative care agreement  Orders Placed This Encounter  Procedures  . DG Knee Complete 4 Views Right  . Ambulatory referral to Orthopedic Surgery    This patient was seen by Leretha Pol FNP Collaboration with Dr [name] as a part of collaborative care agreement   Total Time spent: 18 Minutes      Dr [name] Internal medicine

## 2019-07-01 DIAGNOSIS — M25561 Pain in right knee: Secondary | ICD-10-CM | POA: Insufficient documentation

## 2019-07-21 ENCOUNTER — Telehealth: Payer: Self-pay

## 2019-07-21 NOTE — Telephone Encounter (Signed)
Confirmed appt and covid screen for in office appt 07/23/19

## 2019-07-22 ENCOUNTER — Other Ambulatory Visit: Payer: Self-pay

## 2019-07-22 MED ORDER — LISINOPRIL-HYDROCHLOROTHIAZIDE 10-12.5 MG PO TABS: 1.0000 | ORAL_TABLET | Freq: Every day | ORAL | 1 refills | Status: DC

## 2019-07-23 ENCOUNTER — Ambulatory Visit (INDEPENDENT_AMBULATORY_CARE_PROVIDER_SITE_OTHER): Payer: Medicare HMO | Admitting: Nurse Practitioner

## 2019-07-23 ENCOUNTER — Encounter: Payer: Self-pay | Admitting: Nurse Practitioner

## 2019-07-23 ENCOUNTER — Other Ambulatory Visit: Payer: Self-pay

## 2019-07-23 VITALS — BP 146/77 | HR 72 | Resp 16 | Ht 61.0 in | Wt 136.6 lb

## 2019-07-23 DIAGNOSIS — I10 Essential (primary) hypertension: Secondary | ICD-10-CM

## 2019-07-23 DIAGNOSIS — E782 Mixed hyperlipidemia: Secondary | ICD-10-CM | POA: Diagnosis not present

## 2019-07-23 DIAGNOSIS — R300 Dysuria: Secondary | ICD-10-CM | POA: Diagnosis not present

## 2019-07-23 DIAGNOSIS — M5431 Sciatica, right side: Secondary | ICD-10-CM | POA: Diagnosis not present

## 2019-07-23 DIAGNOSIS — M810 Age-related osteoporosis without current pathological fracture: Secondary | ICD-10-CM

## 2019-07-23 DIAGNOSIS — Z0001 Encounter for general adult medical examination with abnormal findings: Secondary | ICD-10-CM | POA: Diagnosis not present

## 2019-07-23 MED ORDER — IBUPROFEN 600 MG PO TABS: 600.0000 mg | ORAL_TABLET | Freq: Three times a day (TID) | ORAL | 2 refills | Status: DC | PRN

## 2019-07-23 NOTE — Progress Notes (Signed)
[facility] [place], [place] [address]  Internal MEDICINE  Office Visit Note  Patient Name: Samantha Dennis  [id number]  [id number]  Date of Service: 07/25/2019  Chief Complaint  Patient presents with  . Medicare Wellness  . Hypertension  . Hypothyroidism  . Osteoporosis  . Leg Pain    went to ortho and shot did not help her, believes to have sciatic issue, hurts when laying down or sitting down for a longer period of time, hard to get up giving her lots of pain      The patient is here for health maintenance exam today. R lateral leg pain. She reports that the pain started in her right hip around August and has progressively moved down her leg. She reports that the pain occurs with weight-bearing, and is worse in the morning when she gets up, or after prolonged sitting. She states that the pain comes and goes very quickly, but it is "excruciating" when it does occur. She is unable to describe the pain. Pain is mostly around the posterior aspect of the right leg, around the lateral aspect of the right knee and along the posterior part of the right lower leg.  She did see orthopedics, explained to them the problem. They did x-ray of the right knee. Gave her cortisone injection, and it did not help. Pain is not in the knee.   Pt is here for routine health maintenance examination  Current Medication: Outpatient Encounter Medications as of 07/23/2019  Medication Sig Note  . alendronate (FOSAMAX) 70 MG tablet 70 mg once a week.  06/06/2016: Received from: External Pharmacy  . famciclovir (FAMVIR) 250 MG tablet Take 1 tablet (250 mg total) by mouth daily.   Marland Kitchen ibandronate (BONIVA) 150 MG tablet TAKE 1 TABLET EVERY MONTH ON AN EMPTY STOMACH   . levothyroxine (SYNTHROID, LEVOTHROID) 75 MCG tablet TAKE 1 TABLET (75 MCG TOTAL) BY MOUTH DAILY BEFORE BREAKFAST.   Marland Kitchen lisinopril-hydrochlorothiazide (ZESTORETIC) 10-12.5 MG tablet Take 1 tablet by mouth daily.   . rosuvastatin  (CRESTOR) 5 MG tablet TAKE 1 TABLET EVERY DAY   . ibuprofen (ADVIL) 600 MG tablet Take 1 tablet (600 mg total) by mouth every 8 (eight) hours as needed.    No facility-administered encounter medications on file as of 07/23/2019.    Surgical History: Past Surgical History:  Procedure Laterality Date  . TOTAL ABDOMINAL HYSTERECTOMY Bilateral 1984    Medical History: Past Medical History:  Diagnosis Date  . Hay fever   . Hypertension   . Hypothyroidism   . Osteoporosis     Family History: Family History  Problem Relation Age of Onset  . Breast cancer Neg Hx       Review of Systems  Constitutional: Negative for activity change, chills, fatigue and unexpected weight change.  HENT: Negative for congestion, postnasal drip, rhinorrhea, sneezing and sore throat.   Respiratory: Negative for cough, chest tightness, shortness of breath and wheezing.   Cardiovascular: Negative for chest pain and palpitations.  Gastrointestinal: Negative for abdominal pain, constipation, diarrhea, nausea and vomiting.  Genitourinary: Negative for dysuria and frequency.  Musculoskeletal: Positive for arthralgias and myalgias. Negative for back pain, joint swelling and neck pain.       Right lateral knee pain. Severe when she initially changes position from seated to standing, or after she has been stationary standing for several minutes then goes to walk. Pain gets some better after she is moving around. Stretches from just below the right buttock  and down into the right lower leg.   Skin: Negative for rash.  Allergic/Immunologic: Negative for environmental allergies.  Neurological: Negative for dizziness, tremors, numbness and headaches.  Hematological: Negative for adenopathy. Does not bruise/bleed easily.  Psychiatric/Behavioral: Negative for behavioral problems (Depression), sleep disturbance and suicidal ideas. The patient is not nervous/anxious.      Today's Vitals   07/23/19 1136  BP: (!)  146/77  Pulse: 72  Resp: 16  SpO2: 98%  Weight: 136 lb 9.6 oz (62 kg)  Height: 5\' 1"  (1.549 m)   Body mass index is 25.81 kg/m.  Physical Exam Vitals and nursing note reviewed.  Constitutional:      General: She is not in acute distress.    Appearance: Normal appearance. She is well-developed. She is not diaphoretic.  HENT:     Head: Normocephalic and atraumatic.     Right Ear: Tympanic membrane normal.     Left Ear: Tympanic membrane normal.     Nose: Nose normal.     Mouth/Throat:     Mouth: Mucous membranes are moist.     Pharynx: Oropharynx is clear. No oropharyngeal exudate.  Eyes:     Conjunctiva/sclera: Conjunctivae normal.     Pupils: Pupils are equal, round, and reactive to light.  Neck:     Thyroid: No thyromegaly.     Vascular: No carotid bruit or JVD.     Trachea: No tracheal deviation.  Cardiovascular:     Rate and Rhythm: Normal rate and regular rhythm.     Pulses: Normal pulses.     Heart sounds: Normal heart sounds. No murmur. No friction rub. No gallop.   Pulmonary:     Effort: Pulmonary effort is normal. No respiratory distress.     Breath sounds: Normal breath sounds. No wheezing or rales.  Chest:     Chest wall: No tenderness.  Abdominal:     General: Bowel sounds are normal.     Palpations: Abdomen is soft.     Tenderness: There is no abdominal tenderness.  Musculoskeletal:        General: Normal range of motion.     Cervical back: Normal range of motion and neck supple.     Comments: There is moderate to severe pain with palpation from the right buttock down to the right lower leg. There is mild swelling along the lateral aspect of the right knee. There is no tenderness of the lower back. She is visibly uncomfortable while sitting on exam table during the visit.  Lymphadenopathy:     Cervical: No cervical adenopathy.  Skin:    General: Skin is warm and dry.  Neurological:     Mental Status: She is alert and oriented to person, place, and time.      Cranial Nerves: No cranial nerve deficit.  Psychiatric:        Mood and Affect: Mood normal.        Behavior: Behavior normal.        Thought Content: Thought content normal.        Judgment: Judgment normal.    Depression screen [facility] 2/9 07/23/2019 12/09/2018 11/26/2018 07/04/2018 01/07/2018  Decreased Interest 0 0 0 0 0  Down, Depressed, Hopeless 0 0 0 0 0  PHQ - 2 Score 0 0 0 0 0    Functional Status Survey: Is the patient deaf or have difficulty hearing?: No Does the patient have difficulty seeing, even when wearing glasses/contacts?: No Does the patient have difficulty concentrating, remembering, or  making decisions?: No Does the patient have difficulty walking or climbing stairs?: Yes(currently has leg pain) Does the patient have difficulty dressing or bathing?: No Does the patient have difficulty doing errands alone such as visiting a doctor's office or shopping?: No  MMSE - [place] Exam 07/23/2019 07/04/2018  Orientation to time 5 5  Orientation to Place 5 5  Registration 3 3  Attention/ Calculation 5 5  Recall 3 3  Language- name 2 objects 2 2  Language- repeat 1 1  Language- follow 3 step command 3 3  Language- read & follow direction 1 1  Write a sentence 1 1  Copy design 1 1  Total score 30 30    Fall Risk  07/23/2019 01/23/2019 12/09/2018 11/26/2018 07/04/2018  Falls in the past year? 1 0 0 0 0  Number falls in past yr: 0 - 0 - -  Injury with Fall? 0 - - - -      LABS: Recent Results (from the past 2160 hour(s))  UA/M w/rflx Culture, Routine     Status: Abnormal (Preliminary result)   Collection Time: 07/23/19 12:00 AM   Specimen: Urine   URINE  Result Value Ref Range   Specific Gravity, UA 1.022 1.005 - 1.030   pH, UA 5.5 5.0 - 7.5   Color, UA Yellow Yellow   Appearance Ur Clear Clear   Leukocytes,UA Trace (A) Negative   Protein,UA Negative Negative/Trace   Glucose, UA Negative Negative   Ketones, UA Trace (A) Negative   RBC, UA Negative  Negative   Bilirubin, UA Negative Negative   Urobilinogen, Ur 0.2 0.2 - 1.0 mg/dL   Nitrite, UA Negative Negative   Microscopic Examination See below:     Comment: Microscopic was indicated and was performed.   Urinalysis Reflex Comment     Comment: This specimen has reflexed to a Urine Culture.  Microscopic Examination     Status: Abnormal   Collection Time: 07/23/19 12:00 AM   URINE  Result Value Ref Range   WBC, UA 0-5 0 - 5 /hpf   RBC 11-30 (A) 0 - 2 /hpf   Epithelial Cells (non renal) 0-10 0 - 10 /hpf   Casts Present (A) None seen /lpf   Cast Type Hyaline casts N/A   Mucus, UA Present Not Estab.   Bacteria, UA Few None seen/Few  Urine Culture, Reflex     Status: None (Preliminary result)   Collection Time: 07/23/19 12:00 AM   URINE  Result Value Ref Range   Urine Culture, Routine WILL FOLLOW    Assessment/Plan: 1. Encounter for general adult medical examination with abnormal findings Annual health maintenance exam today.  2. Right sided sciatica May take ibuprofen 600mg  up to three times daily as needed for pain/inflammation. Reviewed exercises to relieve sciatica with the patient and printed instructions were provided. Will reassess at next visit.  - ibuprofen (ADVIL) 600 MG tablet; Take 1 tablet (600 mg total) by mouth every 8 (eight) hours as needed.  Dispense: 60 tablet; Refill: 2  3. Essential hypertension, benign Stable. Continue bp medication as prescribed   4. Age-related osteoporosis without current pathological fracture Continue boniva once monthly.   5. Mixed hyperlipidemia Stable. Continue crestor as prescribed   6. Dysuria - UA/M w/rflx Culture, Routine  General Counseling: Araiyah verbalizes understanding of the findings of todays visit and agrees with plan of treatment. I have discussed any further diagnostic evaluation that may be needed or ordered today. We also reviewed her  medications today. she has been encouraged to call the office with any  questions or concerns that should arise related to todays visit.    Counseling:  This patient was seen by Leretha Pol FNP Collaboration with Dr [name] as a part of collaborative care agreement  Orders Placed This Encounter  Procedures  . Microscopic Examination  . Urine Culture, Reflex  . UA/M w/rflx Culture, Routine    Meds ordered this encounter  Medications  . ibuprofen (ADVIL) 600 MG tablet    Sig: Take 1 tablet (600 mg total) by mouth every 8 (eight) hours as needed.    Dispense:  60 tablet    Refill:  2    Order Specific Question:   Supervising Provider    Answer:   [name] [id number]    Total time spent: 6 Minutes  Time spent includes review of chart, medications, test results, and follow up plan with the patient.     [name], MD  Internal Medicine

## 2019-07-23 NOTE — Patient Instructions (Signed)
Sciatica Rehab Ask your health care provider which exercises are safe for you. Do exercises exactly as told by your health care provider and adjust them as directed. It is normal to feel mild stretching, pulling, tightness, or discomfort as you do these exercises. Stop right away if you feel sudden pain or your pain gets worse. Do not begin these exercises until told by your health care provider. Stretching and range-of-motion exercises These exercises warm up your muscles and joints and improve the movement and flexibility of your hips and back. These exercises also help to relieve pain, numbness, and tingling. Sciatic nerve glide 1. Sit in a chair with your head facing down toward your chest. Place your hands behind your back. Let your shoulders slump forward. 2. Slowly straighten one of your legs while you tilt your head back as if you are looking toward the ceiling. Only straighten your leg as far as you can without making your symptoms worse. 3. Hold this position for __________ seconds. 4. Slowly return to the starting position. 5. Repeat with your other leg. Repeat __________ times. Complete this exercise __________ times a day. Knee to chest with hip adduction and internal rotation  1. Lie on your back on a firm surface with both legs straight. 2. Bend one of your knees and move it up toward your chest until you feel a gentle stretch in your lower back and buttock. Then, move your knee toward the shoulder that is on the opposite side from your leg. This is hip adduction and internal rotation. ? Hold your leg in this position by holding on to the front of your knee. 3. Hold this position for __________ seconds. 4. Slowly return to the starting position. 5. Repeat with your other leg. Repeat __________ times. Complete this exercise __________ times a day. Prone extension on elbows  1. Lie on your abdomen on a firm surface. A bed may be too soft for this exercise. 2. Prop yourself up on  your elbows. 3. Use your arms to help lift your chest up until you feel a gentle stretch in your abdomen and your lower back. ? This will place some of your body weight on your elbows. If this is uncomfortable, try stacking pillows under your chest. ? Your hips should stay down, against the surface that you are lying on. Keep your hip and back muscles relaxed. 4. Hold this position for __________ seconds. 5. Slowly relax your upper body and return to the starting position. Repeat __________ times. Complete this exercise __________ times a day. Strengthening exercises These exercises build strength and endurance in your back. Endurance is the ability to use your muscles for a long time, even after they get tired. Pelvic tilt This exercise strengthens the muscles that lie deep in the abdomen. 1. Lie on your back on a firm surface. Bend your knees and keep your feet flat on the floor. 2. Tense your abdominal muscles. Tip your pelvis up toward the ceiling and flatten your lower back into the floor. ? To help with this exercise, you may place a small towel under your lower back and try to push your back into the towel. 3. Hold this position for __________ seconds. 4. Let your muscles relax completely before you repeat this exercise. Repeat __________ times. Complete this exercise __________ times a day. Alternating arm and leg raises  1. Get on your hands and knees on a firm surface. If you are on a hard floor, you may want to use   padding, such as an exercise mat, to cushion your knees. 2. Line up your arms and legs. Your hands should be directly below your shoulders, and your knees should be directly below your hips. 3. Lift your left leg behind you. At the same time, raise your right arm and straighten it in front of you. ? Do not lift your leg higher than your hip. ? Do not lift your arm higher than your shoulder. ? Keep your abdominal and back muscles tight. ? Keep your hips facing the  ground. ? Do not arch your back. ? Keep your balance carefully, and do not hold your breath. 4. Hold this position for __________ seconds. 5. Slowly return to the starting position. 6. Repeat with your right leg and your left arm. Repeat __________ times. Complete this exercise __________ times a day. Posture and body mechanics Good posture and healthy body mechanics can help to relieve stress in your body's tissues and joints. Body mechanics refers to the movements and positions of your body while you do your daily activities. Posture is part of body mechanics. Good posture means:  Your spine is in its natural S-curve position (neutral).  Your shoulders are pulled back slightly.  Your head is not tipped forward. Follow these guidelines to improve your posture and body mechanics in your everyday activities. Standing   When standing, keep your spine neutral and your feet about hip width apart. Keep a slight bend in your knees. Your ears, shoulders, and hips should line up.  When you do a task in which you stand in one place for a long time, place one foot up on a stable object that is 2-4 inches (5-10 cm) high, such as a footstool. This helps keep your spine neutral. Sitting   When sitting, keep your spine neutral and keep your feet flat on the floor. Use a footrest, if necessary, and keep your thighs parallel to the floor. Avoid rounding your shoulders, and avoid tilting your head forward.  When working at a desk or a computer, keep your desk at a height where your hands are slightly lower than your elbows. Slide your chair under your desk so you are close enough to maintain good posture.  When working at a computer, place your monitor at a height where you are looking straight ahead and you do not have to tilt your head forward or downward to look at the screen. Resting  When lying down and resting, avoid positions that are most painful for you.  If you have pain with activities  such as sitting, bending, stooping, or squatting, lie in a position in which your body does not bend very much. For example, avoid curling up on your side with your arms and knees near your chest (fetal position).  If you have pain with activities such as standing for a long time or reaching with your arms, lie with your spine in a neutral position and bend your knees slightly. Try the following positions: ? Lying on your side with a pillow between your knees. ? Lying on your back with a pillow under your knees. Lifting   When lifting objects, keep your feet at least shoulder width apart and tighten your abdominal muscles.  Bend your knees and hips and keep your spine neutral. It is important to lift using the strength of your legs, not your back. Do not lock your knees straight out.  Always ask for help to lift heavy or awkward objects. This information is not   intended to replace advice given to you by your health care provider. Make sure you discuss any questions you have with your health care provider. Document Revised: 10/03/2018 Document Reviewed: 07/03/2018 Elsevier Patient Education  [place].  Sciatica  Sciatica is pain, weakness, tingling, or loss of feeling (numbness) along the sciatic nerve. The sciatic nerve starts in the lower back and goes down the back of each leg. Sciatica usually goes away on its own or with treatment. Sometimes, sciatica may come back (recur). What are the causes? This condition happens when the sciatic nerve is pinched or has pressure put on it. This may be the result of:  A disk in between the bones of the spine bulging out too far (herniated disk).  Changes in the spinal disks that occur with aging.  A condition that affects a muscle in the butt.  Extra bone growth near the sciatic nerve.  A break (fracture) of the area between your hip bones (pelvis).  Pregnancy.  Tumor. This is rare. What increases the risk? You are more likely to  develop this condition if you:  Play sports that put pressure or stress on the spine.  Have poor strength and ease of movement (flexibility).  Have had a back injury in the past.  Have had back surgery.  Sit for long periods of time.  Do activities that involve bending or lifting over and over again.  Are very overweight (obese). What are the signs or symptoms? Symptoms can vary from mild to very bad. They may include:  Any of these problems in the lower back, leg, hip, or butt: ? Mild tingling, loss of feeling, or dull aches. ? Burning sensations. ? Sharp pains.  Loss of feeling in the back of the calf or the sole of the foot.  Leg weakness.  Very bad back pain that makes it hard to move. These symptoms may get worse when you cough, sneeze, or laugh. They may also get worse when you sit or stand for long periods of time. How is this treated? This condition often gets better without any treatment. However, treatment may include:  Changing or cutting back on physical activity when you have pain.  Doing exercises and stretching.  Putting ice or heat on the affected area.  Medicines that help: ? To relieve pain and swelling. ? To relax your muscles.  Shots (injections) of medicines that help to relieve pain, irritation, and swelling.  Surgery. Follow these instructions at home: Medicines  Take over-the-counter and prescription medicines only as told by your doctor.  Ask your doctor if the medicine prescribed to you: ? Requires you to avoid driving or using heavy machinery. ? Can cause trouble pooping (constipation). You may need to take these steps to prevent or treat trouble pooping:  Drink enough fluids to keep your pee (urine) pale yellow.  Take over-the-counter or prescription medicines.  Eat foods that are high in fiber. These include beans, whole grains, and fresh fruits and vegetables.  Limit foods that are high in fat and sugar. These include fried or  sweet foods. Managing pain      If told, put ice on the affected area. ? Put ice in a plastic bag. ? Place a towel between your skin and the bag. ? Leave the ice on for 20 minutes, 2-3 times a day.  If told, put heat on the affected area. Use the heat source that your doctor tells you to use, such as a moist heat pack  or a heating pad. ? Place a towel between your skin and the heat source. ? Leave the heat on for 20-30 minutes. ? Remove the heat if your skin turns bright red. This is very important if you are unable to feel pain, heat, or cold. You may have a greater risk of getting burned. Activity   Return to your normal activities as told by your doctor. Ask your doctor what activities are safe for you.  Avoid activities that make your symptoms worse.  Take short rests during the day. ? When you rest for a long time, do some physical activity or stretching between periods of rest. ? Avoid sitting for a long time without moving. Get up and move around at least one time each hour.  Exercise and stretch regularly, as told by your doctor.  Do not lift anything that is heavier than 10 lb (4.5 kg) while you have symptoms of sciatica. ? Avoid lifting heavy things even when you do not have symptoms. ? Avoid lifting heavy things over and over.  When you lift objects, always lift in a way that is safe for your body. To do this, you should: ? Bend your knees. ? Keep the object close to your body. ? Avoid twisting. General instructions  Stay at a healthy weight.  Wear comfortable shoes that support your feet. Avoid wearing high heels.  Avoid sleeping on a mattress that is too soft or too hard. You might have less pain if you sleep on a mattress that is firm enough to support your back.  Keep all follow-up visits as told by your doctor. This is important. Contact a doctor if:  You have pain that: ? Wakes you up when you are sleeping. ? Gets worse when you lie down. ? Is worse  than the pain you have had in the past. ? Lasts longer than 4 weeks.  You lose weight without trying. Get help right away if:  You cannot control when you pee (urinate) or poop (have a bowel movement).  You have weakness in any of these areas and it gets worse: ? Lower back. ? The area between your hip bones. ? Butt. ? Legs.  You have redness or swelling of your back.  You have a burning feeling when you pee. Summary  Sciatica is pain, weakness, tingling, or loss of feeling (numbness) along the sciatic nerve.  This condition happens when the sciatic nerve is pinched or has pressure put on it.  Sciatica can cause pain, tingling, or loss of feeling (numbness) in the lower back, legs, hips, and butt.  Treatment often includes rest, exercise, medicines, and putting ice or heat on the affected area. This information is not intended to replace advice given to you by your health care provider. Make sure you discuss any questions you have with your health care provider. Document Revised: 06/30/2018 Document Reviewed: 06/30/2018 Elsevier Patient Education  [place].  Sciatica  Sciatica is pain, weakness, tingling, or loss of feeling (numbness) along the sciatic nerve. The sciatic nerve starts in the lower back and goes down the back of each leg. Sciatica usually goes away on its own or with treatment. Sometimes, sciatica may come back (recur). What are the causes? This condition happens when the sciatic nerve is pinched or has pressure put on it. This may be the result of:  A disk in between the bones of the spine bulging out too far (herniated disk).  Changes in the spinal disks that  occur with aging.  A condition that affects a muscle in the butt.  Extra bone growth near the sciatic nerve.  A break (fracture) of the area between your hip bones (pelvis).  Pregnancy.  Tumor. This is rare. What increases the risk? You are more likely to develop this condition if  you:  Play sports that put pressure or stress on the spine.  Have poor strength and ease of movement (flexibility).  Have had a back injury in the past.  Have had back surgery.  Sit for long periods of time.  Do activities that involve bending or lifting over and over again.  Are very overweight (obese). What are the signs or symptoms? Symptoms can vary from mild to very bad. They may include:  Any of these problems in the lower back, leg, hip, or butt: ? Mild tingling, loss of feeling, or dull aches. ? Burning sensations. ? Sharp pains.  Loss of feeling in the back of the calf or the sole of the foot.  Leg weakness.  Very bad back pain that makes it hard to move. These symptoms may get worse when you cough, sneeze, or laugh. They may also get worse when you sit or stand for long periods of time. How is this treated? This condition often gets better without any treatment. However, treatment may include:  Changing or cutting back on physical activity when you have pain.  Doing exercises and stretching.  Putting ice or heat on the affected area.  Medicines that help: ? To relieve pain and swelling. ? To relax your muscles.  Shots (injections) of medicines that help to relieve pain, irritation, and swelling.  Surgery. Follow these instructions at home: Medicines  Take over-the-counter and prescription medicines only as told by your doctor.  Ask your doctor if the medicine prescribed to you: ? Requires you to avoid driving or using heavy machinery. ? Can cause trouble pooping (constipation). You may need to take these steps to prevent or treat trouble pooping:  Drink enough fluids to keep your pee (urine) pale yellow.  Take over-the-counter or prescription medicines.  Eat foods that are high in fiber. These include beans, whole grains, and fresh fruits and vegetables.  Limit foods that are high in fat and sugar. These include fried or sweet foods. Managing  pain      If told, put ice on the affected area. ? Put ice in a plastic bag. ? Place a towel between your skin and the bag. ? Leave the ice on for 20 minutes, 2-3 times a day.  If told, put heat on the affected area. Use the heat source that your doctor tells you to use, such as a moist heat pack or a heating pad. ? Place a towel between your skin and the heat source. ? Leave the heat on for 20-30 minutes. ? Remove the heat if your skin turns bright red. This is very important if you are unable to feel pain, heat, or cold. You may have a greater risk of getting burned. Activity   Return to your normal activities as told by your doctor. Ask your doctor what activities are safe for you.  Avoid activities that make your symptoms worse.  Take short rests during the day. ? When you rest for a long time, do some physical activity or stretching between periods of rest. ? Avoid sitting for a long time without moving. Get up and move around at least one time each hour.  Exercise and stretch  regularly, as told by your doctor.  Do not lift anything that is heavier than 10 lb (4.5 kg) while you have symptoms of sciatica. ? Avoid lifting heavy things even when you do not have symptoms. ? Avoid lifting heavy things over and over.  When you lift objects, always lift in a way that is safe for your body. To do this, you should: ? Bend your knees. ? Keep the object close to your body. ? Avoid twisting. General instructions  Stay at a healthy weight.  Wear comfortable shoes that support your feet. Avoid wearing high heels.  Avoid sleeping on a mattress that is too soft or too hard. You might have less pain if you sleep on a mattress that is firm enough to support your back.  Keep all follow-up visits as told by your doctor. This is important. Contact a doctor if:  You have pain that: ? Wakes you up when you are sleeping. ? Gets worse when you lie down. ? Is worse than the pain you have  had in the past. ? Lasts longer than 4 weeks.  You lose weight without trying. Get help right away if:  You cannot control when you pee (urinate) or poop (have a bowel movement).  You have weakness in any of these areas and it gets worse: ? Lower back. ? The area between your hip bones. ? Butt. ? Legs.  You have redness or swelling of your back.  You have a burning feeling when you pee. Summary  Sciatica is pain, weakness, tingling, or loss of feeling (numbness) along the sciatic nerve.  This condition happens when the sciatic nerve is pinched or has pressure put on it.  Sciatica can cause pain, tingling, or loss of feeling (numbness) in the lower back, legs, hips, and butt.  Treatment often includes rest, exercise, medicines, and putting ice or heat on the affected area. This information is not intended to replace advice given to you by your health care provider. Make sure you discuss any questions you have with your health care provider. Document Revised: 06/30/2018 Document Reviewed: 06/30/2018 Elsevier Patient Education  [place].  Sciatica  Sciatica is pain, weakness, tingling, or loss of feeling (numbness) along the sciatic nerve. The sciatic nerve starts in the lower back and goes down the back of each leg. Sciatica usually goes away on its own or with treatment. Sometimes, sciatica may come back (recur). What are the causes? This condition happens when the sciatic nerve is pinched or has pressure put on it. This may be the result of:  A disk in between the bones of the spine bulging out too far (herniated disk).  Changes in the spinal disks that occur with aging.  A condition that affects a muscle in the butt.  Extra bone growth near the sciatic nerve.  A break (fracture) of the area between your hip bones (pelvis).  Pregnancy.  Tumor. This is rare. What increases the risk? You are more likely to develop this condition if you:  Play sports that  put pressure or stress on the spine.  Have poor strength and ease of movement (flexibility).  Have had a back injury in the past.  Have had back surgery.  Sit for long periods of time.  Do activities that involve bending or lifting over and over again.  Are very overweight (obese). What are the signs or symptoms? Symptoms can vary from mild to very bad. They may include:  Any of these problems  in the lower back, leg, hip, or butt: ? Mild tingling, loss of feeling, or dull aches. ? Burning sensations. ? Sharp pains.  Loss of feeling in the back of the calf or the sole of the foot.  Leg weakness.  Very bad back pain that makes it hard to move. These symptoms may get worse when you cough, sneeze, or laugh. They may also get worse when you sit or stand for long periods of time. How is this treated? This condition often gets better without any treatment. However, treatment may include:  Changing or cutting back on physical activity when you have pain.  Doing exercises and stretching.  Putting ice or heat on the affected area.  Medicines that help: ? To relieve pain and swelling. ? To relax your muscles.  Shots (injections) of medicines that help to relieve pain, irritation, and swelling.  Surgery. Follow these instructions at home: Medicines  Take over-the-counter and prescription medicines only as told by your doctor.  Ask your doctor if the medicine prescribed to you: ? Requires you to avoid driving or using heavy machinery. ? Can cause trouble pooping (constipation). You may need to take these steps to prevent or treat trouble pooping:  Drink enough fluids to keep your pee (urine) pale yellow.  Take over-the-counter or prescription medicines.  Eat foods that are high in fiber. These include beans, whole grains, and fresh fruits and vegetables.  Limit foods that are high in fat and sugar. These include fried or sweet foods. Managing pain      If told, put  ice on the affected area. ? Put ice in a plastic bag. ? Place a towel between your skin and the bag. ? Leave the ice on for 20 minutes, 2-3 times a day.  If told, put heat on the affected area. Use the heat source that your doctor tells you to use, such as a moist heat pack or a heating pad. ? Place a towel between your skin and the heat source. ? Leave the heat on for 20-30 minutes. ? Remove the heat if your skin turns bright red. This is very important if you are unable to feel pain, heat, or cold. You may have a greater risk of getting burned. Activity   Return to your normal activities as told by your doctor. Ask your doctor what activities are safe for you.  Avoid activities that make your symptoms worse.  Take short rests during the day. ? When you rest for a long time, do some physical activity or stretching between periods of rest. ? Avoid sitting for a long time without moving. Get up and move around at least one time each hour.  Exercise and stretch regularly, as told by your doctor.  Do not lift anything that is heavier than 10 lb (4.5 kg) while you have symptoms of sciatica. ? Avoid lifting heavy things even when you do not have symptoms. ? Avoid lifting heavy things over and over.  When you lift objects, always lift in a way that is safe for your body. To do this, you should: ? Bend your knees. ? Keep the object close to your body. ? Avoid twisting. General instructions  Stay at a healthy weight.  Wear comfortable shoes that support your feet. Avoid wearing high heels.  Avoid sleeping on a mattress that is too soft or too hard. You might have less pain if you sleep on a mattress that is firm enough to support your back.  Keep  all follow-up visits as told by your doctor. This is important. Contact a doctor if:  You have pain that: ? Wakes you up when you are sleeping. ? Gets worse when you lie down. ? Is worse than the pain you have had in the past. ? Lasts  longer than 4 weeks.  You lose weight without trying. Get help right away if:  You cannot control when you pee (urinate) or poop (have a bowel movement).  You have weakness in any of these areas and it gets worse: ? Lower back. ? The area between your hip bones. ? Butt. ? Legs.  You have redness or swelling of your back.  You have a burning feeling when you pee. Summary  Sciatica is pain, weakness, tingling, or loss of feeling (numbness) along the sciatic nerve.  This condition happens when the sciatic nerve is pinched or has pressure put on it.  Sciatica can cause pain, tingling, or loss of feeling (numbness) in the lower back, legs, hips, and butt.  Treatment often includes rest, exercise, medicines, and putting ice or heat on the affected area. This information is not intended to replace advice given to you by your health care provider. Make sure you discuss any questions you have with your health care provider. Document Revised: 06/30/2018 Document Reviewed: 06/30/2018 Elsevier Patient Education  [place].  Sciatica Rehab Ask your health care provider which exercises are safe for you. Do exercises exactly as told by your health care provider and adjust them as directed. It is normal to feel mild stretching, pulling, tightness, or discomfort as you do these exercises. Stop right away if you feel sudden pain or your pain gets worse. Do not begin these exercises until told by your health care provider. Stretching and range-of-motion exercises These exercises warm up your muscles and joints and improve the movement and flexibility of your hips and back. These exercises also help to relieve pain, numbness, and tingling. Sciatic nerve glide 6. Sit in a chair with your head facing down toward your chest. Place your hands behind your back. Let your shoulders slump forward. 7. Slowly straighten one of your legs while you tilt your head back as if you are looking toward the  ceiling. Only straighten your leg as far as you can without making your symptoms worse. 8. Hold this position for __________ seconds. 9. Slowly return to the starting position. 10. Repeat with your other leg. Repeat __________ times. Complete this exercise __________ times a day. Knee to chest with hip adduction and internal rotation  6. Lie on your back on a firm surface with both legs straight. 7. Bend one of your knees and move it up toward your chest until you feel a gentle stretch in your lower back and buttock. Then, move your knee toward the shoulder that is on the opposite side from your leg. This is hip adduction and internal rotation. ? Hold your leg in this position by holding on to the front of your knee. 8. Hold this position for __________ seconds. 9. Slowly return to the starting position. 10. Repeat with your other leg. Repeat __________ times. Complete this exercise __________ times a day. Prone extension on elbows  6. Lie on your abdomen on a firm surface. A bed may be too soft for this exercise. 7. Prop yourself up on your elbows. 8. Use your arms to help lift your chest up until you feel a gentle stretch in your abdomen and your lower back. ?  This will place some of your body weight on your elbows. If this is uncomfortable, try stacking pillows under your chest. ? Your hips should stay down, against the surface that you are lying on. Keep your hip and back muscles relaxed. 9. Hold this position for __________ seconds. 10. Slowly relax your upper body and return to the starting position. Repeat __________ times. Complete this exercise __________ times a day. Strengthening exercises These exercises build strength and endurance in your back. Endurance is the ability to use your muscles for a long time, even after they get tired. Pelvic tilt This exercise strengthens the muscles that lie deep in the abdomen. 5. Lie on your back on a firm surface. Bend your knees and keep  your feet flat on the floor. 6. Tense your abdominal muscles. Tip your pelvis up toward the ceiling and flatten your lower back into the floor. ? To help with this exercise, you may place a small towel under your lower back and try to push your back into the towel. 7. Hold this position for __________ seconds. 8. Let your muscles relax completely before you repeat this exercise. Repeat __________ times. Complete this exercise __________ times a day. Alternating arm and leg raises  7. Get on your hands and knees on a firm surface. If you are on a hard floor, you may want to use padding, such as an exercise mat, to cushion your knees. 8. Line up your arms and legs. Your hands should be directly below your shoulders, and your knees should be directly below your hips. 9. Lift your left leg behind you. At the same time, raise your right arm and straighten it in front of you. ? Do not lift your leg higher than your hip. ? Do not lift your arm higher than your shoulder. ? Keep your abdominal and back muscles tight. ? Keep your hips facing the ground. ? Do not arch your back. ? Keep your balance carefully, and do not hold your breath. 10. Hold this position for __________ seconds. 11. Slowly return to the starting position. 12. Repeat with your right leg and your left arm. Repeat __________ times. Complete this exercise __________ times a day. Posture and body mechanics Good posture and healthy body mechanics can help to relieve stress in your body's tissues and joints. Body mechanics refers to the movements and positions of your body while you do your daily activities. Posture is part of body mechanics. Good posture means:  Your spine is in its natural S-curve position (neutral).  Your shoulders are pulled back slightly.  Your head is not tipped forward. Follow these guidelines to improve your posture and body mechanics in your everyday activities. Standing   When standing, keep your spine  neutral and your feet about hip width apart. Keep a slight bend in your knees. Your ears, shoulders, and hips should line up.  When you do a task in which you stand in one place for a long time, place one foot up on a stable object that is 2-4 inches (5-10 cm) high, such as a footstool. This helps keep your spine neutral. Sitting   When sitting, keep your spine neutral and keep your feet flat on the floor. Use a footrest, if necessary, and keep your thighs parallel to the floor. Avoid rounding your shoulders, and avoid tilting your head forward.  When working at a desk or a computer, keep your desk at a height where your hands are slightly lower than your elbows.  Slide your chair under your desk so you are close enough to maintain good posture.  When working at a computer, place your monitor at a height where you are looking straight ahead and you do not have to tilt your head forward or downward to look at the screen. Resting  When lying down and resting, avoid positions that are most painful for you.  If you have pain with activities such as sitting, bending, stooping, or squatting, lie in a position in which your body does not bend very much. For example, avoid curling up on your side with your arms and knees near your chest (fetal position).  If you have pain with activities such as standing for a long time or reaching with your arms, lie with your spine in a neutral position and bend your knees slightly. Try the following positions: ? Lying on your side with a pillow between your knees. ? Lying on your back with a pillow under your knees. Lifting   When lifting objects, keep your feet at least shoulder width apart and tighten your abdominal muscles.  Bend your knees and hips and keep your spine neutral. It is important to lift using the strength of your legs, not your back. Do not lock your knees straight out.  Always ask for help to lift heavy or awkward objects. This information is  not intended to replace advice given to you by your health care provider. Make sure you discuss any questions you have with your health care provider. Document Revised: 10/03/2018 Document Reviewed: 07/03/2018 Elsevier Patient Education  [place].

## 2019-07-24 NOTE — Progress Notes (Signed)
Waiting for results of culture and sensitivity

## 2019-07-25 DIAGNOSIS — M5431 Sciatica, right side: Secondary | ICD-10-CM | POA: Insufficient documentation

## 2019-07-25 DIAGNOSIS — Z0001 Encounter for general adult medical examination with abnormal findings: Secondary | ICD-10-CM | POA: Insufficient documentation

## 2019-07-26 LAB — MICROSCOPIC EXAMINATION

## 2019-07-26 LAB — UA/M W/RFLX CULTURE, ROUTINE
Bilirubin, UA: NEGATIVE
Glucose, UA: NEGATIVE
Nitrite, UA: NEGATIVE
Protein,UA: NEGATIVE
RBC, UA: NEGATIVE
Specific Gravity, UA: 1.022 (ref 1.005–1.030)
Urobilinogen, Ur: 0.2 mg/dL (ref 0.2–1.0)
pH, UA: 5.5 (ref 5.0–7.5)

## 2019-07-26 LAB — URINE CULTURE, REFLEX

## 2019-07-27 ENCOUNTER — Telehealth: Payer: Self-pay

## 2019-07-27 ENCOUNTER — Other Ambulatory Visit: Payer: Self-pay

## 2019-07-27 ENCOUNTER — Other Ambulatory Visit: Payer: Self-pay | Admitting: Nurse Practitioner

## 2019-07-27 DIAGNOSIS — N390 Urinary tract infection, site not specified: Secondary | ICD-10-CM

## 2019-07-27 MED ORDER — CIPROFLOXACIN HCL 500 MG PO TABS: 500.0000 mg | ORAL_TABLET | Freq: Two times a day (BID) | ORAL | 0 refills | Status: DC

## 2019-07-27 NOTE — Telephone Encounter (Signed)
-----   Message from Ronnell Freshwater, NP sent at 07/27/2019  8:37 AM EST ----- Please let the patient know that Patient had uti at time of visit. Added cipro 500mg  bid for 7 days. Sent to [facility]. Thanks.

## 2019-07-27 NOTE — Progress Notes (Signed)
Please let the patient know that Patient had uti at time of visit. Added cipro 500mg  bid for 7 days. Sent to [facility]. Thanks.

## 2019-07-27 NOTE — Progress Notes (Signed)
Patient had uti at time of visit. Added cipro 500mg  bid for 7 days. Sent to [facility].

## 2019-07-27 NOTE — Telephone Encounter (Signed)
Pt advised urine did showed UTI we send antibiotic

## 2019-07-30 ENCOUNTER — Other Ambulatory Visit: Payer: Self-pay

## 2019-07-30 MED ORDER — ROSUVASTATIN CALCIUM 5 MG PO TABS: 5.0000 mg | ORAL_TABLET | Freq: Every day | ORAL | 1 refills | Status: DC

## 2019-08-04 DIAGNOSIS — M5441 Lumbago with sciatica, right side: Secondary | ICD-10-CM | POA: Diagnosis not present

## 2019-08-04 DIAGNOSIS — M5431 Sciatica, right side: Secondary | ICD-10-CM | POA: Diagnosis not present

## 2019-08-04 DIAGNOSIS — M5442 Lumbago with sciatica, left side: Secondary | ICD-10-CM | POA: Diagnosis not present

## 2019-08-05 ENCOUNTER — Other Ambulatory Visit: Payer: Self-pay | Admitting: Physician Assistant

## 2019-08-05 ENCOUNTER — Ambulatory Visit
Admission: RE | Admit: 2019-08-05 | Discharge: 2019-08-05 | Disposition: A | Discharge status: Home / Self Care | Payer: Medicare HMO | Source: Ambulatory Visit | Attending: Orthopedic Surgery | Admitting: Orthopedic Surgery

## 2019-08-05 ENCOUNTER — Other Ambulatory Visit: Payer: Self-pay

## 2019-08-05 ENCOUNTER — Other Ambulatory Visit: Payer: Self-pay | Admitting: Orthopedic Surgery

## 2019-08-05 DIAGNOSIS — M48061 Spinal stenosis, lumbar region without neurogenic claudication: Secondary | ICD-10-CM | POA: Diagnosis not present

## 2019-08-05 DIAGNOSIS — G8929 Other chronic pain: Secondary | ICD-10-CM

## 2019-08-05 DIAGNOSIS — M5441 Lumbago with sciatica, right side: Secondary | ICD-10-CM

## 2019-08-05 DIAGNOSIS — M5431 Sciatica, right side: Secondary | ICD-10-CM

## 2019-08-06 ENCOUNTER — Other Ambulatory Visit: Payer: Self-pay | Admitting: Physician Assistant

## 2019-08-06 ENCOUNTER — Ambulatory Visit
Admission: RE | Admit: 2019-08-06 | Discharge: 2019-08-06 | Disposition: A | Discharge status: Home / Self Care | Payer: Medicare HMO | Source: Ambulatory Visit | Attending: Physician Assistant | Admitting: Physician Assistant

## 2019-08-06 DIAGNOSIS — M48061 Spinal stenosis, lumbar region without neurogenic claudication: Secondary | ICD-10-CM | POA: Diagnosis not present

## 2019-08-06 DIAGNOSIS — M5431 Sciatica, right side: Secondary | ICD-10-CM

## 2019-08-06 MED ORDER — IOPAMIDOL (ISOVUE-M 200) INJECTION 41%
1.0000 mL | Freq: Once | INTRAMUSCULAR | Status: AC
  Administered 2019-08-06: 1 mL via INTRA_ARTICULAR

## 2019-08-06 MED ORDER — METHYLPREDNISOLONE ACETATE 40 MG/ML INJ SUSP (RADIOLOG
120.0000 mg | Freq: Once | INTRAMUSCULAR | Status: AC
  Administered 2019-08-06: 120 mg via INTRA_ARTICULAR

## 2019-08-06 NOTE — Discharge Instructions (Signed)

## 2019-08-10 ENCOUNTER — Other Ambulatory Visit: Payer: Self-pay

## 2019-08-10 MED ORDER — FAMCICLOVIR 250 MG PO TABS: 250.0000 mg | ORAL_TABLET | Freq: Every day | ORAL | 1 refills | Status: DC

## 2019-08-17 DIAGNOSIS — L821 Other seborrheic keratosis: Secondary | ICD-10-CM | POA: Diagnosis not present

## 2019-08-17 DIAGNOSIS — D2261 Melanocytic nevi of right upper limb, including shoulder: Secondary | ICD-10-CM | POA: Diagnosis not present

## 2019-08-17 DIAGNOSIS — D2262 Melanocytic nevi of left upper limb, including shoulder: Secondary | ICD-10-CM | POA: Diagnosis not present

## 2019-08-17 DIAGNOSIS — D2271 Melanocytic nevi of right lower limb, including hip: Secondary | ICD-10-CM | POA: Diagnosis not present

## 2019-08-17 DIAGNOSIS — D225 Melanocytic nevi of trunk: Secondary | ICD-10-CM | POA: Diagnosis not present

## 2019-08-17 DIAGNOSIS — D2272 Melanocytic nevi of left lower limb, including hip: Secondary | ICD-10-CM | POA: Diagnosis not present

## 2019-08-19 ENCOUNTER — Other Ambulatory Visit: Payer: Self-pay | Admitting: Nurse Practitioner

## 2019-08-19 DIAGNOSIS — E039 Hypothyroidism, unspecified: Secondary | ICD-10-CM | POA: Diagnosis not present

## 2019-08-19 DIAGNOSIS — I10 Essential (primary) hypertension: Secondary | ICD-10-CM | POA: Diagnosis not present

## 2019-08-19 DIAGNOSIS — Z0001 Encounter for general adult medical examination with abnormal findings: Secondary | ICD-10-CM | POA: Diagnosis not present

## 2019-08-19 DIAGNOSIS — E559 Vitamin D deficiency, unspecified: Secondary | ICD-10-CM | POA: Diagnosis not present

## 2019-08-20 LAB — LIPID PANEL W/O CHOL/HDL RATIO
Cholesterol, Total: 188 mg/dL (ref 100–199)
HDL: 105 mg/dL (ref >39)
LDL Chol Calc (NIH): 71 mg/dL (ref 0–99)
Triglycerides: 64 mg/dL (ref 0–149)
VLDL Cholesterol Cal: 12 mg/dL (ref 5–40)

## 2019-08-20 LAB — COMPREHENSIVE METABOLIC PANEL
ALT: 23 IU/L (ref 0–32)
AST: 26 IU/L (ref 0–40)
Albumin/Globulin Ratio: 1.8 (ref 1.2–2.2)
Albumin: 4.7 g/dL — ABNORMAL HIGH (ref 3.6–4.6)
Alkaline Phosphatase: 41 IU/L (ref 39–117)
BUN/Creatinine Ratio: 23 (ref 12–28)
BUN: 16 mg/dL (ref 8–27)
Bilirubin Total: 0.7 mg/dL (ref 0.0–1.2)
CO2: 25 mmol/L (ref 20–29)
Calcium: 9.6 mg/dL (ref 8.7–10.3)
Chloride: 95 mmol/L — ABNORMAL LOW (ref 96–106)
Creatinine, Ser: 0.71 mg/dL (ref 0.57–1.00)
GFR calc Af Amer: 91 mL/min/{1.73_m2} (ref >59)
GFR calc non Af Amer: 79 mL/min/{1.73_m2} (ref >59)
Globulin, Total: 2.6 g/dL (ref 1.5–4.5)
Glucose: 98 mg/dL (ref 65–99)
Potassium: 4.7 mmol/L (ref 3.5–5.2)
Sodium: 135 mmol/L (ref 134–144)
Total Protein: 7.3 g/dL (ref 6.0–8.5)

## 2019-08-20 LAB — CBC
Hematocrit: 47.5 % — ABNORMAL HIGH (ref 34.0–46.6)
Hemoglobin: 16.2 g/dL — ABNORMAL HIGH (ref 11.1–15.9)
MCH: 31.5 pg (ref 26.6–33.0)
MCHC: 34.1 g/dL (ref 31.5–35.7)
MCV: 92 fL (ref 79–97)
Platelets: 237 10*3/uL (ref 150–450)
RBC: 5.14 x10E6/uL (ref 3.77–5.28)
RDW: 12.9 % (ref 11.7–15.4)
WBC: 9.8 10*3/uL (ref 3.4–10.8)

## 2019-08-20 LAB — T4, FREE: Free T4: 2.15 ng/dL — ABNORMAL HIGH (ref 0.82–1.77)

## 2019-08-20 LAB — VITAMIN D 25 HYDROXY (VIT D DEFICIENCY, FRACTURES): Vit D, 25-Hydroxy: 54.1 ng/mL (ref 30.0–100.0)

## 2019-08-20 LAB — TSH: TSH: 1.12 u[IU]/mL (ref 0.450–4.500)

## 2019-08-21 ENCOUNTER — Ambulatory Visit: Payer: Medicare HMO | Attending: Internal Medicine

## 2019-08-21 DIAGNOSIS — Z23 Encounter for immunization: Secondary | ICD-10-CM | POA: Insufficient documentation

## 2019-08-21 NOTE — Progress Notes (Signed)
Free T4 a little high. Discuss symptoms. May adjust levothyroxine. Normal TSH. Review with patient 09/03/2019.

## 2019-08-21 NOTE — Progress Notes (Signed)
   Covid-19 Vaccination Clinic  Name:  Samantha Dennis    MRN: [id number] DOB: Dec 03, 1933  08/21/2019  [name] was observed post Covid-19 immunization for 15 minutes without incidence. She was provided with Vaccine Information Sheet and instruction to access the V-Safe system.   [name] was instructed to call 911 with any severe reactions post vaccine: Marland Kitchen Difficulty breathing  . Swelling of your face and throat  . A fast heartbeat  . A bad rash all over your body  . Dizziness and weakness    Immunizations Administered    Name Date Dose VIS Date Route   Pfizer COVID-19 Vaccine 08/21/2019 12:02 PM 0.3 mL 06/05/2019 Intramuscular   Manufacturer: [place]   Lot: [id number]   [place]: [id number]

## 2019-09-01 ENCOUNTER — Telehealth: Payer: Self-pay

## 2019-09-01 NOTE — Telephone Encounter (Signed)
CONFIRMED AND SCREENED FOR 09-03-19 OV.

## 2019-09-03 ENCOUNTER — Other Ambulatory Visit: Payer: Self-pay

## 2019-09-03 ENCOUNTER — Ambulatory Visit (INDEPENDENT_AMBULATORY_CARE_PROVIDER_SITE_OTHER): Payer: Medicare HMO | Admitting: Nurse Practitioner

## 2019-09-03 ENCOUNTER — Encounter: Payer: Self-pay | Admitting: Nurse Practitioner

## 2019-09-03 VITALS — BP 162/80 | HR 73 | Resp 16 | Ht 61.0 in | Wt 135.2 lb

## 2019-09-03 DIAGNOSIS — M7138 Other bursal cyst, other site: Secondary | ICD-10-CM

## 2019-09-03 DIAGNOSIS — M5431 Sciatica, right side: Secondary | ICD-10-CM | POA: Diagnosis not present

## 2019-09-03 DIAGNOSIS — I10 Essential (primary) hypertension: Secondary | ICD-10-CM | POA: Diagnosis not present

## 2019-09-03 NOTE — Progress Notes (Signed)
[facility] [place], [place] [address]  Internal MEDICINE  Office Visit Note  Patient Name: Samantha Dennis  [id number]  [id number]  Date of Service: 09/03/2019  Chief Complaint  Patient presents with  . Hypertension  . Hyperlipidemia  . Hypothyroidism    Patient c/o severe right sided sciatica at her most recent visit. Started with conservative therapy. She was prescribed ibuprofen 600mg  up to three times daily as needed. she was also given written prescriptions for exercises one can do to help relieve sciatic nerve pain. When this did not help, she did see orthopedic provider. Patient had CT scan on lumbar spine with injection into spinal cord to help with severe right sided sciatica. CT showed large, synovial cyst in L4/L5region causing severe canal stenosis. She states that the injection into her back caused the worst pain she ever felt, but after a day, the pain subsided, including the pain from sciatica. When she followed up with the orthopedic provider, there was no real plan for further treatment. She states that radiologist told her that pain would most likely come back, even if the shot was effective. The patient has been extremely anxious about this. She states that she is living in near constant fear that pain she felt before will come back. Blood pressure is elevated above her normal today. This needs to have additional evaluation and treatment per neurosurgery.      Current Medication: Outpatient Encounter Medications as of 09/03/2019  Medication Sig  . famciclovir (FAMVIR) 250 MG tablet Take 1 tablet (250 mg total) by mouth daily.  Marland Kitchen ibandronate (BONIVA) 150 MG tablet TAKE 1 TABLET EVERY MONTH ON AN EMPTY STOMACH  . ibuprofen (ADVIL) 600 MG tablet Take 1 tablet (600 mg total) by mouth every 8 (eight) hours as needed.  Marland Kitchen levothyroxine (SYNTHROID, LEVOTHROID) 75 MCG tablet TAKE 1 TABLET (75 MCG TOTAL) BY MOUTH DAILY BEFORE BREAKFAST.  Marland Kitchen  lisinopril-hydrochlorothiazide (ZESTORETIC) 10-12.5 MG tablet Take 1 tablet by mouth daily.  . rosuvastatin (CRESTOR) 5 MG tablet Take 1 tablet (5 mg total) by mouth daily.  . [DISCONTINUED] ciprofloxacin (CIPRO) 500 MG tablet Take 1 tablet (500 mg total) by mouth 2 (two) times daily. (Patient not taking: Reported on 09/03/2019)   No facility-administered encounter medications on file as of 09/03/2019.    Surgical History: Past Surgical History:  Procedure Laterality Date  . TOTAL ABDOMINAL HYSTERECTOMY Bilateral 1984    Medical History: Past Medical History:  Diagnosis Date  . Hay fever   . Hypertension   . Hypothyroidism   . Osteoporosis     Family History: Family History  Problem Relation Age of Onset  . Breast cancer Neg Hx     Social History   Socioeconomic History  . Marital status: Married    Spouse name: Not on file  . Number of children: Not on file  . Years of education: Not on file  . Highest education level: Not on file  Occupational History  . Not on file  Tobacco Use  . Smoking status: Never Smoker  . Smokeless tobacco: Never Used  Substance and Sexual Activity  . Alcohol use: Yes    Alcohol/week: 1.0 standard drinks    Types: 1 Glasses of wine per week    Comment: ocassionally   . Drug use: No  . Sexual activity: Not on file  Other Topics Concern  . Not on file  Social History Narrative  . Not on file   Social Determinants of Health  Financial Resource Strain:   . Difficulty of Paying Living Expenses:   Food Insecurity:   . Worried About Charity fundraiser in the Last Year:   . Arboriculturist in the Last Year:   Transportation Needs:   . Film/video editor (Medical):   Marland Kitchen Lack of Transportation (Non-Medical):   Physical Activity:   . Days of Exercise per Week:   . Minutes of Exercise per Session:   Stress:   . Feeling of Stress :   Social Connections:   . Frequency of Communication with Friends and Family:   . Frequency of  Social Gatherings with Friends and Family:   . Attends Religious Services:   . Active Member of Clubs or Organizations:   . Attends Archivist Meetings:   Marland Kitchen Marital Status:   Intimate Partner Violence:   . Fear of Current or Ex-Partner:   . Emotionally Abused:   Marland Kitchen Physically Abused:   . Sexually Abused:       Review of Systems  Constitutional: Negative for activity change, chills, fatigue and unexpected weight change.  HENT: Negative for congestion, postnasal drip, rhinorrhea, sneezing and sore throat.   Respiratory: Negative for cough, chest tightness, shortness of breath and wheezing.   Cardiovascular: Negative for chest pain and palpitations.       Elevated blood pressure today.   Gastrointestinal: Negative for abdominal pain, constipation, diarrhea, nausea and vomiting.  Endocrine: Negative for cold intolerance, heat intolerance, polydipsia and polyuria.  Musculoskeletal: Positive for arthralgias, back pain and myalgias. Negative for joint swelling and neck pain.       Right sided sciatica currently resolved.   Skin: Negative for rash.  Allergic/Immunologic: Negative for environmental allergies.  Neurological: Negative for dizziness, tremors, numbness and headaches.  Hematological: Negative for adenopathy. Does not bruise/bleed easily.  Psychiatric/Behavioral: Negative for behavioral problems (Depression), sleep disturbance and suicidal ideas. The patient is nervous/anxious.     Today's Vitals   09/03/19 1146  BP: (!) 162/80  Pulse: 73  Resp: 16  SpO2: 98%  Weight: 135 lb 3.2 oz (61.3 kg)  Height: 5\' 1"  (1.549 m)   Body mass index is 25.55 kg/m.  Physical Exam Vitals and nursing note reviewed.  Constitutional:      General: She is not in acute distress.    Appearance: Normal appearance. She is well-developed. She is not diaphoretic.  HENT:     Head: Normocephalic and atraumatic.     Mouth/Throat:     Pharynx: No oropharyngeal exudate.  Eyes:      Pupils: Pupils are equal, round, and reactive to light.  Neck:     Thyroid: No thyromegaly.     Vascular: No carotid bruit or JVD.     Trachea: No tracheal deviation.  Cardiovascular:     Rate and Rhythm: Normal rate and regular rhythm.     Heart sounds: Normal heart sounds. No murmur. No friction rub. No gallop.   Pulmonary:     Effort: Pulmonary effort is normal. No respiratory distress.     Breath sounds: Normal breath sounds. No wheezing or rales.  Chest:     Chest wall: No tenderness.  Abdominal:     Palpations: Abdomen is soft.  Musculoskeletal:        General: Normal range of motion.     Cervical back: Normal range of motion and neck supple.  Lymphadenopathy:     Cervical: No cervical adenopathy.  Skin:    General: Skin is warm  and dry.  Neurological:     Mental Status: She is alert and oriented to person, place, and time.     Cranial Nerves: No cranial nerve deficit.  Psychiatric:        Attention and Perception: Attention and perception normal.        Mood and Affect: Affect normal. Mood is anxious.        Speech: Speech normal.        Behavior: Behavior normal. Behavior is cooperative.        Thought Content: Thought content normal.        Cognition and Memory: Cognition and memory normal.        Judgment: Judgment normal.    Assessment/Plan: 1. Synovial cyst of lumbar facet joint Reviewed CT scan of lumbar spine with the patient. Of most concern, it showed Large 13 mm right L4-5 facet synovial cyst causing severe canal stenosis with significant right subarticular stenosis and compression of the traversing L5 nerve root. Recommend correlation with radiculopathies and possible associated radicular symptoms. Hypertrophic facet changes at this level also contribute to moderate bilateral foraminal narrowing.  Refer to neurosurgery for further evaluation. - Ambulatory referral to Neurosurgery  2. Right sided sciatica - Ambulatory referral to Neurosurgery  3. Essential  hypertension, benign Generally stable. No changes to bp medication today. Will continue to monitor.   General Counseling: renell picardi understanding of the findings of todays visit and agrees with plan of treatment. I have discussed any further diagnostic evaluation that may be needed or ordered today. We also reviewed her medications today. she has been encouraged to call the office with any questions or concerns that should arise related to todays visit.    Orders Placed This Encounter  Procedures  . Ambulatory referral to Neurosurgery   This patient was seen by Leretha Pol FNP Collaboration with Dr [name] as a part of collaborative care agreement  Total time spent: 30 Minutes   Time spent includes review of chart, medications, test results, and follow up plan with the patient.      Dr [name] Internal medicine

## 2019-09-10 ENCOUNTER — Other Ambulatory Visit: Payer: Self-pay

## 2019-09-10 DIAGNOSIS — M7138 Other bursal cyst, other site: Secondary | ICD-10-CM | POA: Diagnosis not present

## 2019-09-10 DIAGNOSIS — I10 Essential (primary) hypertension: Secondary | ICD-10-CM | POA: Diagnosis not present

## 2019-09-10 DIAGNOSIS — Z6825 Body mass index (BMI) 25.0-25.9, adult: Secondary | ICD-10-CM | POA: Diagnosis not present

## 2019-09-10 MED ORDER — LEVOTHYROXINE SODIUM 75 MCG PO TABS: 75.0000 ug | ORAL_TABLET | Freq: Every day | ORAL | 3 refills | Status: DC

## 2019-09-16 ENCOUNTER — Ambulatory Visit: Payer: Medicare HMO | Attending: Internal Medicine

## 2019-09-16 DIAGNOSIS — Z23 Encounter for immunization: Secondary | ICD-10-CM

## 2019-09-16 NOTE — Progress Notes (Signed)
   Covid-19 Vaccination Clinic  Name:  Samantha Dennis    MRN: [id number] DOB: 09/26/1933  09/16/2019  [name] was observed post Covid-19 immunization for 15 minutes without incident. She was provided with Vaccine Information Sheet and instruction to access the V-Safe system.   [name] was instructed to call 911 with any severe reactions post vaccine: Marland Kitchen Difficulty breathing  . Swelling of face and throat  . A fast heartbeat  . A bad rash all over body  . Dizziness and weakness   Immunizations Administered    Name Date Dose VIS Date Route   Pfizer COVID-19 Vaccine 09/16/2019  3:32 PM 0.3 mL 06/05/2019 Intramuscular   Manufacturer: [place]   Lot: [id number]   [place]: [id number]

## 2019-09-22 DIAGNOSIS — H25013 Cortical age-related cataract, bilateral: Secondary | ICD-10-CM | POA: Diagnosis not present

## 2019-10-06 DIAGNOSIS — M7138 Other bursal cyst, other site: Secondary | ICD-10-CM | POA: Diagnosis not present

## 2019-10-07 ENCOUNTER — Other Ambulatory Visit: Payer: Self-pay | Admitting: Neurological Surgery

## 2019-11-02 ENCOUNTER — Encounter (HOSPITAL_COMMUNITY): Payer: Self-pay

## 2019-11-02 ENCOUNTER — Encounter (HOSPITAL_COMMUNITY)
Admission: RE | Admit: 2019-11-02 | Discharge: 2019-11-02 | Disposition: A | Discharge status: Home / Self Care | Payer: Medicare HMO | Source: Ambulatory Visit | Attending: Neurological Surgery | Admitting: Neurological Surgery

## 2019-11-02 ENCOUNTER — Other Ambulatory Visit (HOSPITAL_COMMUNITY)
Admission: RE | Admit: 2019-11-02 | Discharge: 2019-11-02 | Disposition: A | Discharge status: Home / Self Care | Payer: Medicare HMO | Source: Ambulatory Visit | Attending: Neurological Surgery | Admitting: Neurological Surgery

## 2019-11-02 ENCOUNTER — Other Ambulatory Visit: Payer: Self-pay

## 2019-11-02 DIAGNOSIS — Z20822 Contact with and (suspected) exposure to covid-19: Secondary | ICD-10-CM | POA: Diagnosis not present

## 2019-11-02 DIAGNOSIS — R9431 Abnormal electrocardiogram [ECG] [EKG]: Secondary | ICD-10-CM | POA: Insufficient documentation

## 2019-11-02 DIAGNOSIS — I10 Essential (primary) hypertension: Secondary | ICD-10-CM | POA: Diagnosis not present

## 2019-11-02 DIAGNOSIS — Z01818 Encounter for other preprocedural examination: Secondary | ICD-10-CM | POA: Diagnosis not present

## 2019-11-02 DIAGNOSIS — M7130 Other bursal cyst, unspecified site: Secondary | ICD-10-CM | POA: Diagnosis not present

## 2019-11-02 HISTORY — DX: Urinary tract infection, site not specified: N39.0

## 2019-11-02 LAB — CBC WITH DIFFERENTIAL/PLATELET
Abs Immature Granulocytes: 0.03 10*3/uL (ref 0.00–0.07)
Basophils Absolute: 0.1 10*3/uL (ref 0.0–0.1)
Basophils Relative: 1 %
Eosinophils Absolute: 0.1 10*3/uL (ref 0.0–0.5)
Eosinophils Relative: 1 %
HCT: 42.7 % (ref 36.0–46.0)
Hemoglobin: 14.7 g/dL (ref 12.0–15.0)
Immature Granulocytes: 0 %
Lymphocytes Relative: 30 %
Lymphs Abs: 2.3 10*3/uL (ref 0.7–4.0)
MCH: 32.5 pg (ref 26.0–34.0)
MCHC: 34.4 g/dL (ref 30.0–36.0)
MCV: 94.3 fL (ref 80.0–100.0)
Monocytes Absolute: 0.8 10*3/uL (ref 0.1–1.0)
Monocytes Relative: 10 %
Neutro Abs: 4.3 10*3/uL (ref 1.7–7.7)
Neutrophils Relative %: 58 %
Platelets: 209 10*3/uL (ref 150–400)
RBC: 4.53 MIL/uL (ref 3.87–5.11)
RDW: 12.7 % (ref 11.5–15.5)
WBC: 7.7 10*3/uL (ref 4.0–10.5)
nRBC: 0 % (ref 0.0–0.2)

## 2019-11-02 LAB — BASIC METABOLIC PANEL
Anion gap: 12 (ref 5–15)
BUN: 11 mg/dL (ref 8–23)
CO2: 27 mmol/L (ref 22–32)
Calcium: 9.5 mg/dL (ref 8.9–10.3)
Chloride: 100 mmol/L (ref 98–111)
Creatinine, Ser: 0.63 mg/dL (ref 0.44–1.00)
GFR calc Af Amer: >60 mL/min (ref >60)
GFR calc non Af Amer: >60 mL/min (ref >60)
Glucose, Bld: 104 mg/dL — ABNORMAL HIGH (ref 70–99)
Potassium: 3.4 mmol/L — ABNORMAL LOW (ref 3.5–5.1)
Sodium: 139 mmol/L (ref 135–145)

## 2019-11-02 LAB — SARS CORONAVIRUS 2 (TAT 6-24 HRS): SARS Coronavirus 2: NEGATIVE

## 2019-11-02 LAB — PROTIME-INR
INR: 1 (ref 0.8–1.2)
Prothrombin Time: 12.3 seconds (ref 11.4–15.2)

## 2019-11-02 LAB — SURGICAL PCR SCREEN
MRSA, PCR: NEGATIVE
Staphylococcus aureus: NEGATIVE

## 2019-11-02 NOTE — Progress Notes (Signed)
PCP - Samantha Dennis @ [facility] in [place] Cardiologist - na    Chest x-ray - 11/02/19 EKG - 11/02/19 Stress Test - na ECHO - na Cardiac Cath - na  Sleep Study - na   Fasting Blood Sugar - na Checks Blood Sugar _____ times a day  Blood Thinner Instructions: na Aspirin Instructions:  na  ERAS Protcol -na PRE-SURGERY Ensure or G2-   COVID TEST- 11/02/19   Anesthesia review: ekg  Patient denies shortness of breath, fever, cough and chest pain at PAT appointment   All instructions explained to the patient, with a verbal understanding of the material. Patient agrees to go over the instructions while at home for a better understanding. Patient also instructed to self quarantine after being tested for COVID-19. The opportunity to ask questions was provided.

## 2019-11-02 NOTE — Pre-Procedure Instructions (Signed)
Samantha Dennis  11/02/2019      [facility] [id number] [place] [address] Phone: [phone] Fax: [phone]  [facility] [place] [place] [address] Phone: [phone] Fax: [phone]    Your procedure is scheduled on May 12  Report to [facility] Entrance A at 6:30 A.M.  Call this number if you have problems the morning of surgery:  [phone]   Remember:  Do not eat or drink after midnight.      Take these medicines the morning of surgery with A SIP OF WATER :             Levothyroxine (synthroid)            Rosuvastatin (crestor)    Do not wear jewelry.  Do not wear lotions, powders, or perfumes, or deodorant.  Do not shave 48 hours prior to surgery.  Men may shave face and neck.  Do not bring valuables to the hospital.  [facility] is not responsible for any belongings or valuables.  Contacts, dentures or bridgework may not be worn into surgery.  Leave your suitcase in the car.  After surgery it may be brought to your room.  For patients admitted to the hospital, discharge time will be determined by your treatment team.  Patients discharged the day of surgery will not be allowed to drive home.    Special instructions:   [facility]- Preparing For Surgery  Before surgery, you can play an important role. Because skin is not sterile, your skin needs to be as free of germs as possible. You can reduce the number of germs on your skin by washing with CHG (chlorahexidine gluconate) Soap before surgery.  CHG is an antiseptic cleaner which kills germs and bonds with the skin to continue killing germs even after washing.    Oral Hygiene is also important to reduce your risk of infection.  Remember - BRUSH YOUR TEETH THE MORNING OF SURGERY WITH YOUR REGULAR TOOTHPASTE  Please do not use if you have  an allergy to CHG or antibacterial soaps. If your skin becomes reddened/irritated stop using the CHG.  Do not shave (including legs and underarms) for at least 48 hours prior to first CHG shower. It is OK to shave your face.  Please follow these instructions carefully.   1. Shower the NIGHT BEFORE SURGERY and the MORNING OF SURGERY with CHG.   2. If you chose to wash your hair, wash your hair first as usual with your normal shampoo.  3. After you shampoo, rinse your hair and body thoroughly to remove the shampoo.  4. Use CHG as you would any other liquid soap. You can apply CHG directly to the skin and wash gently with a scrungie or a clean washcloth.   5. Apply the CHG Soap to your body ONLY FROM THE NECK DOWN.  Do not use on open wounds or open sores. Avoid contact with your eyes, ears, mouth and genitals (private parts). Wash Face and genitals (private parts)  with your normal soap.  6. Wash thoroughly, paying special attention to the area where your surgery will be performed.  7. Thoroughly rinse your body with warm water from the neck down.  8. DO NOT shower/wash with your normal soap after using and rinsing off the CHG Soap.  9. Pat yourself dry with a CLEAN TOWEL.  10. Wear CLEAN PAJAMAS to bed the night before surgery, wear comfortable clothes the morning of surgery  11. Place CLEAN SHEETS on your bed the night of your first shower and DO NOT SLEEP WITH PETS.    Day of Surgery:  Do not apply any deodorants/lotions.  Please wear clean clothes to the hospital/surgery center.   Remember to brush your teeth WITH YOUR REGULAR TOOTHPASTE.    Please read over the following fact sheets that you were given. Coughing and Deep Breathing and Surgical Site Infection Prevention

## 2019-11-03 NOTE — Anesthesia Preprocedure Evaluation (Addendum)
Anesthesia Evaluation  Patient identified by MRN, date of birth, ID band Patient awake    Reviewed: Allergy & Precautions, NPO status , Patient's Chart, lab work & pertinent test results  History of Anesthesia Complications Negative for: history of anesthetic complications  Airway Mallampati: II  TM Distance: >3 FB Neck ROM: Full    Dental no notable dental hx. (+) Teeth Intact   Pulmonary neg pulmonary ROS,    Pulmonary exam normal        Cardiovascular hypertension, Normal cardiovascular exam     Neuro/Psych negative neurological ROS  negative psych ROS   GI/Hepatic negative GI ROS, Neg liver ROS,   Endo/Other  Hypothyroidism   Renal/GU negative Renal ROS  negative genitourinary   Musculoskeletal negative musculoskeletal ROS (+)   Abdominal   Peds  Hematology negative hematology ROS (+)   Anesthesia Other Findings  TTE 01/07/2017 (copy on chart): Conclusion: Left ventricular systolic function is normal. Mild concentric left ventricular hypertrophy. Evidence for left ventricular diastolic dysfunction. Mild aortic valve sclerosis. Trace pulmonic regurgitation. IVC is normal in size and collapses greater than 50% with inspiration.  Reproductive/Obstetrics                           Anesthesia Physical Anesthesia Plan  ASA: II  Anesthesia Plan: General   Post-op Pain Management:    Induction: Intravenous  PONV Risk Score and Plan: 3 and Ondansetron, Dexamethasone, Treatment may vary due to age or medical condition and Midazolam  Airway Management Planned: Oral ETT  Additional Equipment: None  Intra-op Plan:   Post-operative Plan: Extubation in OR  Informed Consent: I have reviewed the patients History and Physical, chart, labs and discussed the procedure including the risks, benefits and alternatives for the proposed anesthesia with the patient or authorized representative  who has indicated his/her understanding and acceptance.     Dental advisory given  Plan Discussed with:   Anesthesia Plan Comments: (Pt with abnormal EKG at PAT. Tracing shows NSR with lateral T wave inversions. Pt has no cardiac history, denied CV symptoms at PAT appointment. Echo 2018 essentially normal. Tracing reviewed with Dr. Tobias Alexander, he advised that in the absence of symptoms pt can proceed as planned.   Preop labs reviewed, WNL.  TTE 01/07/2017 (copy on chart): Conclusion: Left ventricular systolic function is normal. Mild concentric left ventricular hypertrophy. Evidence for left ventricular diastolic dysfunction. Mild aortic valve sclerosis. Trace pulmonic regurgitation. IVC is normal in size and collapses greater than 50% with inspiration.)       Anesthesia Quick Evaluation

## 2019-11-03 NOTE — Progress Notes (Signed)
Anesthesia Chart Review:  Pt with abnormal EKG at PAT. Tracing shows NSR with lateral T wave inversions. Pt has no cardiac history, denied CV symptoms at PAT appointment. Echo 2018 essentially normal. Tracing reviewed with Dr. Tobias Alexander, he advised that in the absence of symptoms pt can proceed as planned.   Preop labs reviewed, WNL.  TTE 01/07/2017 (copy on chart): Conclusion: Left ventricular systolic function is normal. Mild concentric left ventricular hypertrophy. Evidence for left ventricular diastolic dysfunction. Mild aortic valve sclerosis. Trace pulmonic regurgitation. IVC is normal in size and collapses greater than 50% with inspiration.   Wynonia Musty [facility]/Anesthesiology Phone [phone] 11/03/2019 11:21 AM

## 2019-11-04 ENCOUNTER — Other Ambulatory Visit: Payer: Self-pay

## 2019-11-04 ENCOUNTER — Ambulatory Visit (HOSPITAL_COMMUNITY)
Admission: RE | Admit: 2019-11-04 | Discharge: 2019-11-04 | Disposition: A | Discharge status: Home / Self Care | Payer: Medicare HMO | Attending: Neurological Surgery | Admitting: Neurological Surgery

## 2019-11-04 ENCOUNTER — Encounter (HOSPITAL_COMMUNITY): Payer: Self-pay | Admitting: Neurological Surgery

## 2019-11-04 ENCOUNTER — Ambulatory Visit (HOSPITAL_COMMUNITY): Payer: Medicare HMO

## 2019-11-04 ENCOUNTER — Ambulatory Visit (HOSPITAL_COMMUNITY): Payer: Medicare HMO | Admitting: Physician Assistant

## 2019-11-04 ENCOUNTER — Ambulatory Visit (HOSPITAL_COMMUNITY): Payer: Medicare HMO | Admitting: Registered Nurse

## 2019-11-04 ENCOUNTER — Encounter (HOSPITAL_COMMUNITY)
Admission: RE | Disposition: A | Discharge status: Home / Self Care | Payer: Self-pay | Source: Home / Self Care | Attending: Neurological Surgery

## 2019-11-04 DIAGNOSIS — Z9889 Other specified postprocedural states: Secondary | ICD-10-CM

## 2019-11-04 DIAGNOSIS — Z79890 Hormone replacement therapy (postmenopausal): Secondary | ICD-10-CM | POA: Insufficient documentation

## 2019-11-04 DIAGNOSIS — Z79899 Other long term (current) drug therapy: Secondary | ICD-10-CM | POA: Diagnosis not present

## 2019-11-04 DIAGNOSIS — M1990 Unspecified osteoarthritis, unspecified site: Secondary | ICD-10-CM | POA: Insufficient documentation

## 2019-11-04 DIAGNOSIS — E039 Hypothyroidism, unspecified: Secondary | ICD-10-CM | POA: Diagnosis not present

## 2019-11-04 DIAGNOSIS — M48061 Spinal stenosis, lumbar region without neurogenic claudication: Secondary | ICD-10-CM | POA: Diagnosis not present

## 2019-11-04 DIAGNOSIS — M7138 Other bursal cyst, other site: Secondary | ICD-10-CM | POA: Insufficient documentation

## 2019-11-04 DIAGNOSIS — E782 Mixed hyperlipidemia: Secondary | ICD-10-CM | POA: Diagnosis not present

## 2019-11-04 DIAGNOSIS — I10 Essential (primary) hypertension: Secondary | ICD-10-CM | POA: Insufficient documentation

## 2019-11-04 DIAGNOSIS — Z419 Encounter for procedure for purposes other than remedying health state, unspecified: Secondary | ICD-10-CM

## 2019-11-04 HISTORY — PX: LUMBAR LAMINECTOMY/DECOMPRESSION MICRODISCECTOMY: SHX5026

## 2019-11-04 SURGERY — LUMBAR LAMINECTOMY/DECOMPRESSION MICRODISCECTOMY 1 LEVEL
Anesthesia: General | Site: Back | Laterality: Right

## 2019-11-04 MED ORDER — HYDROCHLOROTHIAZIDE 12.5 MG PO CAPS
12.5000 mg | ORAL_CAPSULE | Freq: Every day | ORAL | Status: DC
  Administered 2019-11-04: 12.5 mg via ORAL
  Filled 2019-11-04: qty 1

## 2019-11-04 MED ORDER — PROPOFOL 10 MG/ML IV BOLUS
INTRAVENOUS | Status: AC
  Filled 2019-11-04: qty 20

## 2019-11-04 MED ORDER — BUPIVACAINE HCL (PF) 0.25 % IJ SOLN
INTRAMUSCULAR | Status: AC
  Filled 2019-11-04: qty 30

## 2019-11-04 MED ORDER — LIDOCAINE 2% (20 MG/ML) 5 ML SYRINGE
INTRAMUSCULAR | Status: AC
  Filled 2019-11-04: qty 5

## 2019-11-04 MED ORDER — SUGAMMADEX SODIUM 200 MG/2ML IV SOLN
INTRAVENOUS | Status: DC | PRN
  Administered 2019-11-04 (×2): 100 mg via INTRAVENOUS

## 2019-11-04 MED ORDER — 0.9 % SODIUM CHLORIDE (POUR BTL) OPTIME
TOPICAL | Status: DC | PRN
  Administered 2019-11-04: 1000 mL

## 2019-11-04 MED ORDER — ONDANSETRON HCL 4 MG/2ML IJ SOLN
INTRAMUSCULAR | Status: AC
  Filled 2019-11-04: qty 2

## 2019-11-04 MED ORDER — DEXAMETHASONE SODIUM PHOSPHATE 10 MG/ML IJ SOLN
10.0000 mg | Freq: Once | INTRAMUSCULAR | Status: AC
  Administered 2019-11-04: 09:00:00 10 mg via INTRAVENOUS
  Filled 2019-11-04: qty 1

## 2019-11-04 MED ORDER — PROPOFOL 10 MG/ML IV BOLUS
INTRAVENOUS | Status: DC | PRN
  Administered 2019-11-04: 130 mg via INTRAVENOUS

## 2019-11-04 MED ORDER — HEMOSTATIC AGENTS (NO CHARGE) OPTIME
TOPICAL | Status: DC | PRN
  Administered 2019-11-04: 1 via TOPICAL

## 2019-11-04 MED ORDER — DEXAMETHASONE SODIUM PHOSPHATE 10 MG/ML IJ SOLN
INTRAMUSCULAR | Status: AC
  Filled 2019-11-04: qty 1

## 2019-11-04 MED ORDER — SODIUM CHLORIDE 0.9% FLUSH: 3.0000 mL | INTRAVENOUS | Status: DC | PRN

## 2019-11-04 MED ORDER — ONDANSETRON HCL 4 MG PO TABS: 4.0000 mg | ORAL_TABLET | Freq: Four times a day (QID) | ORAL | Status: DC | PRN

## 2019-11-04 MED ORDER — THROMBIN 5000 UNITS EX SOLR
CUTANEOUS | Status: AC
  Filled 2019-11-04: qty 15000

## 2019-11-04 MED ORDER — THROMBIN 5000 UNITS EX SOLR
CUTANEOUS | Status: DC | PRN
  Administered 2019-11-04: 10000 [IU] via TOPICAL

## 2019-11-04 MED ORDER — FENTANYL CITRATE (PF) 250 MCG/5ML IJ SOLN
INTRAMUSCULAR | Status: AC
  Filled 2019-11-04: qty 5

## 2019-11-04 MED ORDER — SODIUM CHLORIDE 0.9 % IV SOLN: 250.0000 mL | INTRAVENOUS | Status: DC

## 2019-11-04 MED ORDER — ACETAMINOPHEN 10 MG/ML IV SOLN
INTRAVENOUS | Status: DC | PRN
  Administered 2019-11-04: 1000 mg via INTRAVENOUS

## 2019-11-04 MED ORDER — HYDROCODONE-ACETAMINOPHEN 7.5-325 MG PO TABS: 1.0000 | ORAL_TABLET | ORAL | 0 refills | Status: DC | PRN

## 2019-11-04 MED ORDER — LISINOPRIL-HYDROCHLOROTHIAZIDE 10-12.5 MG PO TABS: 1.0000 | ORAL_TABLET | Freq: Every day | ORAL | Status: DC

## 2019-11-04 MED ORDER — CEFAZOLIN SODIUM-DEXTROSE 2-4 GM/100ML-% IV SOLN
2.0000 g | Freq: Three times a day (TID) | INTRAVENOUS | Status: DC
  Administered 2019-11-04: 2 g via INTRAVENOUS
  Filled 2019-11-04: qty 100

## 2019-11-04 MED ORDER — PHENOL 1.4 % MT LIQD: 1.0000 | OROMUCOSAL | Status: DC | PRN

## 2019-11-04 MED ORDER — LEVOTHYROXINE SODIUM 75 MCG PO TABS: 75.0000 ug | ORAL_TABLET | Freq: Every day | ORAL | Status: DC

## 2019-11-04 MED ORDER — LIDOCAINE 2% (20 MG/ML) 5 ML SYRINGE
INTRAMUSCULAR | Status: DC | PRN
  Administered 2019-11-04: 100 mg via INTRAVENOUS

## 2019-11-04 MED ORDER — DEXAMETHASONE SODIUM PHOSPHATE 10 MG/ML IJ SOLN: INTRAMUSCULAR | Status: DC | PRN

## 2019-11-04 MED ORDER — OXYCODONE HCL 5 MG/5ML PO SOLN: 5.0000 mg | Freq: Once | ORAL | Status: DC | PRN

## 2019-11-04 MED ORDER — POTASSIUM CHLORIDE IN NACL 20-0.9 MEQ/L-% IV SOLN: INTRAVENOUS | Status: DC

## 2019-11-04 MED ORDER — ROCURONIUM BROMIDE 10 MG/ML (PF) SYRINGE
PREFILLED_SYRINGE | INTRAVENOUS | Status: AC
  Filled 2019-11-04: qty 10

## 2019-11-04 MED ORDER — LUTEIN 20 MG PO TABS: 20.0000 mg | ORAL_TABLET | Freq: Every day | ORAL | Status: DC

## 2019-11-04 MED ORDER — FENTANYL CITRATE (PF) 100 MCG/2ML IJ SOLN
INTRAMUSCULAR | Status: AC
  Filled 2019-11-04: qty 2

## 2019-11-04 MED ORDER — SODIUM CHLORIDE 0.9 % IV SOLN
INTRAVENOUS | Status: DC | PRN
  Administered 2019-11-04: 500 mL

## 2019-11-04 MED ORDER — THROMBIN 5000 UNITS EX SOLR
OROMUCOSAL | Status: DC | PRN
  Administered 2019-11-04: 5 mL via TOPICAL

## 2019-11-04 MED ORDER — EPHEDRINE SULFATE-NACL 50-0.9 MG/10ML-% IV SOSY
PREFILLED_SYRINGE | INTRAVENOUS | Status: DC | PRN
  Administered 2019-11-04 (×4): 5 mg via INTRAVENOUS

## 2019-11-04 MED ORDER — SODIUM CHLORIDE 0.9% FLUSH: 3.0000 mL | Freq: Two times a day (BID) | INTRAVENOUS | Status: DC

## 2019-11-04 MED ORDER — CHLORHEXIDINE GLUCONATE CLOTH 2 % EX PADS: 6.0000 | MEDICATED_PAD | Freq: Once | CUTANEOUS | Status: DC

## 2019-11-04 MED ORDER — PHENYLEPHRINE 40 MCG/ML (10ML) SYRINGE FOR IV PUSH (FOR BLOOD PRESSURE SUPPORT)
PREFILLED_SYRINGE | INTRAVENOUS | Status: AC
  Filled 2019-11-04: qty 10

## 2019-11-04 MED ORDER — LACTATED RINGERS IV SOLN: INTRAVENOUS | Status: DC | PRN

## 2019-11-04 MED ORDER — ACETAMINOPHEN 10 MG/ML IV SOLN
INTRAVENOUS | Status: AC
  Filled 2019-11-04: qty 100

## 2019-11-04 MED ORDER — SENNA 8.6 MG PO TABS
1.0000 | ORAL_TABLET | Freq: Two times a day (BID) | ORAL | Status: DC
  Administered 2019-11-04: 8.6 mg via ORAL
  Filled 2019-11-04: qty 1

## 2019-11-04 MED ORDER — CELECOXIB 200 MG PO CAPS
200.0000 mg | ORAL_CAPSULE | Freq: Two times a day (BID) | ORAL | Status: DC
  Administered 2019-11-04: 200 mg via ORAL
  Filled 2019-11-04: qty 1

## 2019-11-04 MED ORDER — LISINOPRIL 10 MG PO TABS
10.0000 mg | ORAL_TABLET | Freq: Every day | ORAL | Status: DC
  Administered 2019-11-04: 10 mg via ORAL
  Filled 2019-11-04: qty 1

## 2019-11-04 MED ORDER — MENTHOL 3 MG MT LOZG: 1.0000 | LOZENGE | OROMUCOSAL | Status: DC | PRN

## 2019-11-04 MED ORDER — OXYCODONE HCL 5 MG PO TABS: 5.0000 mg | ORAL_TABLET | Freq: Once | ORAL | Status: DC | PRN

## 2019-11-04 MED ORDER — ONDANSETRON HCL 4 MG/2ML IJ SOLN: 4.0000 mg | Freq: Once | INTRAMUSCULAR | Status: DC | PRN

## 2019-11-04 MED ORDER — ONDANSETRON HCL 4 MG/2ML IJ SOLN: 4.0000 mg | Freq: Four times a day (QID) | INTRAMUSCULAR | Status: DC | PRN

## 2019-11-04 MED ORDER — MORPHINE SULFATE (PF) 2 MG/ML IV SOLN: 1.0000 mg | INTRAVENOUS | Status: DC | PRN

## 2019-11-04 MED ORDER — FENTANYL CITRATE (PF) 250 MCG/5ML IJ SOLN
INTRAMUSCULAR | Status: DC | PRN
  Administered 2019-11-04: 100 ug via INTRAVENOUS

## 2019-11-04 MED ORDER — FENTANYL CITRATE (PF) 100 MCG/2ML IJ SOLN
25.0000 ug | INTRAMUSCULAR | Status: DC | PRN
  Administered 2019-11-04 (×2): 25 ug via INTRAVENOUS

## 2019-11-04 MED ORDER — CEFAZOLIN SODIUM-DEXTROSE 2-4 GM/100ML-% IV SOLN
2.0000 g | INTRAVENOUS | Status: AC
  Administered 2019-11-04: 09:00:00 2 g via INTRAVENOUS
  Filled 2019-11-04: qty 100

## 2019-11-04 MED ORDER — ACETAMINOPHEN 325 MG PO TABS: 650.0000 mg | ORAL_TABLET | ORAL | Status: DC | PRN

## 2019-11-04 MED ORDER — PHENYLEPHRINE 40 MCG/ML (10ML) SYRINGE FOR IV PUSH (FOR BLOOD PRESSURE SUPPORT)
PREFILLED_SYRINGE | INTRAVENOUS | Status: DC | PRN
  Administered 2019-11-04: 80 ug via INTRAVENOUS

## 2019-11-04 MED ORDER — BUPIVACAINE HCL (PF) 0.25 % IJ SOLN
INTRAMUSCULAR | Status: DC | PRN
  Administered 2019-11-04: 8 mL

## 2019-11-04 MED ORDER — ACETAMINOPHEN 650 MG RE SUPP: 650.0000 mg | RECTAL | Status: DC | PRN

## 2019-11-04 MED ORDER — EPHEDRINE 5 MG/ML INJ
INTRAVENOUS | Status: AC
  Filled 2019-11-04: qty 10

## 2019-11-04 MED ORDER — HYDROCODONE-ACETAMINOPHEN 7.5-325 MG PO TABS
1.0000 | ORAL_TABLET | Freq: Four times a day (QID) | ORAL | Status: DC
  Administered 2019-11-04 (×2): 1 via ORAL
  Filled 2019-11-04 (×2): qty 1

## 2019-11-04 MED ORDER — ROCURONIUM BROMIDE 10 MG/ML (PF) SYRINGE
PREFILLED_SYRINGE | INTRAVENOUS | Status: DC | PRN
  Administered 2019-11-04: 65 mg via INTRAVENOUS

## 2019-11-04 MED ORDER — ONDANSETRON HCL 4 MG/2ML IJ SOLN
INTRAMUSCULAR | Status: DC | PRN
  Administered 2019-11-04: 4 mg via INTRAVENOUS

## 2019-11-04 SURGICAL SUPPLY — 43 items
BAG DECANTER FOR FLEXI CONT (MISCELLANEOUS) ×3 IMPLANT
BAND RUBBER #18 3X1/16 STRL (MISCELLANEOUS) ×6 IMPLANT
BENZOIN TINCTURE PRP APPL 2/3 (GAUZE/BANDAGES/DRESSINGS) ×3 IMPLANT
BUR CARBIDE MATCH 3.0 (BURR) ×3 IMPLANT
CANISTER SUCT 3000ML PPV (MISCELLANEOUS) ×3 IMPLANT
CARTRIDGE OIL MAESTRO DRILL (MISCELLANEOUS) ×1 IMPLANT
CLOSURE WOUND 1/2 X4 (GAUZE/BANDAGES/DRESSINGS) ×1
COVER WAND RF STERILE (DRAPES) ×3 IMPLANT
DIFFUSER DRILL AIR PNEUMATIC (MISCELLANEOUS) ×3 IMPLANT
DRAPE LAPAROTOMY 100X72X124 (DRAPES) ×3 IMPLANT
DRAPE MICROSCOPE LEICA (MISCELLANEOUS) ×3 IMPLANT
DRAPE SURG 17X23 STRL (DRAPES) ×3 IMPLANT
DRSG OPSITE POSTOP 3X4 (GAUZE/BANDAGES/DRESSINGS) ×3 IMPLANT
DURAPREP 26ML APPLICATOR (WOUND CARE) ×3 IMPLANT
ELECT REM PT RETURN 9FT ADLT (ELECTROSURGICAL) ×3
ELECTRODE REM PT RTRN 9FT ADLT (ELECTROSURGICAL) ×1 IMPLANT
GAUZE 4X4 16PLY RFD (DISPOSABLE) IMPLANT
GLOVE BIO SURGEON STRL SZ7 (GLOVE) IMPLANT
GLOVE BIO SURGEON STRL SZ8 (GLOVE) ×3 IMPLANT
GLOVE BIOGEL PI IND STRL 7.0 (GLOVE) IMPLANT
GLOVE BIOGEL PI INDICATOR 7.0 (GLOVE)
GOWN STRL REUS W/ TWL LRG LVL3 (GOWN DISPOSABLE) IMPLANT
GOWN STRL REUS W/ TWL XL LVL3 (GOWN DISPOSABLE) ×1 IMPLANT
GOWN STRL REUS W/TWL 2XL LVL3 (GOWN DISPOSABLE) IMPLANT
GOWN STRL REUS W/TWL LRG LVL3 (GOWN DISPOSABLE)
GOWN STRL REUS W/TWL XL LVL3 (GOWN DISPOSABLE) ×2
KIT BASIN OR (CUSTOM PROCEDURE TRAY) ×3 IMPLANT
KIT TURNOVER KIT B (KITS) ×3 IMPLANT
NEEDLE HYPO 25X1 1.5 SAFETY (NEEDLE) ×3 IMPLANT
NEEDLE SPNL 20GX3.5 QUINCKE YW (NEEDLE) IMPLANT
NS IRRIG 1000ML POUR BTL (IV SOLUTION) ×3 IMPLANT
OIL CARTRIDGE MAESTRO DRILL (MISCELLANEOUS) ×3
PACK LAMINECTOMY NEURO (CUSTOM PROCEDURE TRAY) ×3 IMPLANT
PAD ARMBOARD 7.5X6 YLW CONV (MISCELLANEOUS) ×9 IMPLANT
SPONGE SURGIFOAM ABS GEL SZ50 (HEMOSTASIS) IMPLANT
STRIP CLOSURE SKIN 1/2X4 (GAUZE/BANDAGES/DRESSINGS) ×2 IMPLANT
SUT VIC AB 0 CT1 18XCR BRD8 (SUTURE) ×1 IMPLANT
SUT VIC AB 0 CT1 8-18 (SUTURE) ×2
SUT VIC AB 2-0 CP2 18 (SUTURE) ×3 IMPLANT
SUT VIC AB 3-0 SH 8-18 (SUTURE) ×3 IMPLANT
TOWEL GREEN STERILE (TOWEL DISPOSABLE) ×3 IMPLANT
TOWEL GREEN STERILE FF (TOWEL DISPOSABLE) ×3 IMPLANT
WATER STERILE IRR 1000ML POUR (IV SOLUTION) ×3 IMPLANT

## 2019-11-04 NOTE — Progress Notes (Signed)
PHARMACIST - PHYSICIAN ORDER COMMUNICATION  CONCERNING: P&T Medication Policy on Herbal Medications  DESCRIPTION:  This patient's order for:  lutein  has been noted.  This product(s) is classified as an "herbal" or natural product. Due to a lack of definitive safety studies or FDA approval, nonstandard manufacturing practices, plus the potential risk of unknown drug-drug interactions while on inpatient medications, the Pharmacy and Therapeutics Committee does not permit the use of "herbal" or natural products of this type within [facility].   ACTION TAKEN: The pharmacy department is unable to verify this order at this time and your patient has been informed of this safety policy. Please reevaluate patient's clinical condition at discharge and address if the herbal or natural product(s) should be resumed at that time.  Asya Derryberry A. Levada Dy, PharmD, BCPS, FNKF Clinical Pharmacist [place] Please utilize Amion for appropriate phone number to reach the unit pharmacist ([place])

## 2019-11-04 NOTE — Progress Notes (Signed)
Discharged instructions/education/AVS given to patient and she verbalized understanding. All questions asked were appropriately answered and patient is satisfied. Pain is mild to moderate and controlled by prn medications per patient. Dressing clean, dry and intact. No swelling, no drainage, no redness noted on incision site. Ambulating well with supervision. Voiding and emptying bladder well. Eating and swallowing well with no complaints. Awaiting for transport.

## 2019-11-04 NOTE — Transfer of Care (Signed)
Immediate Anesthesia Transfer of Care Note  Patient: Samantha Dennis  Procedure(s) Performed: Laminectomy for facet/synovial cyst - Lumbar Four- Lumbar Five - right (Right Back)  Patient Location: PACU  Anesthesia Type:General  Level of Consciousness: awake, alert  and oriented  Airway & Oxygen Therapy: Patient Spontanous Breathing and Patient connected to nasal cannula oxygen  Post-op Assessment: Report given to RN and Post -op Vital signs reviewed and stable  Post vital signs: Reviewed and stable  Last Vitals:  Vitals Value Taken Time  BP 146/68 11/04/19 1025  Temp    Pulse 71 11/04/19 1030  Resp 16 11/04/19 1030  SpO2 100 % 11/04/19 1030  Vitals shown include unvalidated device data.  Last Pain:  Vitals:   11/04/19 1025  TempSrc:   PainSc: (P) 0-No pain         Complications: No apparent anesthesia complications

## 2019-11-04 NOTE — Op Note (Signed)
11/04/2019  10:16 AM  PATIENT:  Samantha Dennis  86 y.o. 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 Imaging showed large synovial cyst L4-5 on the right causing spinal stenosis. The patient tried conservative measures without relief. Pain was debilitating. Recommended resection of the synovial cyst. Patient understood the risks, benefits, and alternatives and potential outcomes and wished to proceed.  PROCEDURE DETAILS: The patient was taken to the operating room and after induction of adequate generalized endotracheal anesthesia, the patient was rolled into the prone position on the Wilson frame and all pressure points were padded. The lumbar region was cleaned and then prepped with DuraPrep and draped in the usual sterile fashion. 5 cc of local anesthesia was injected and then a dorsal midline incision was made and carried down to the lumbo sacral fascia. The fascia was opened and the paraspinous musculature was taken down in a subperiosteal fashion to expose L4-5 bilaterally. Intraoperative x-ray confirmed my level, and then I remove the spinous process of L4 and used a combination of the high-speed drill and the Kerrison punches to perform a laminectomy, medial facetectomy, and foraminotomy at L4-5 bilaterally. The underlying yellow ligament was opened and removed in a piecemeal fashion to expose the underlying dura and right-sided synovial cyst.  I was able to work from the midline laterally to do tether the cyst  from the dura and remove it in a piecemeal fashion. I undercut the lateral recess and dissected down until I was medial to and distal to the pedicle of L5 on the right.  Continued to remove pieces of the cyst until the neural elements were visible.  Remove some of the top of the lamina of L5 on the right.  This allowed [place] to get even further distally until we were distal to the pedicle where the cyst finally stopped.  The nerve root was well decompressed. We then gently retracted the nerve root medially with a retractor, coagulated the epidural venous vasculature, and inspected the disc space.  I then palpated with a coronary dilator along the nerve root and into the foramen to assure adequate decompression. I felt no more compression of the nerve root. I irrigated with saline solution containing bacitracin. Achieved hemostasis with bipolar cautery, lined the dura with Gelfoam, and then closed the fascia with 0 Vicryl. I closed the subcutaneous tissues with 2-0 Vicryl and the subcuticular tissues with 3-0 Vicryl. The skin was then closed with benzoin and Steri-Strips. The drapes were removed, a sterile dressing was applied.  My nurse practitioner was involved in the exposure, safe retraction of the neural elements, the disc work and the closure. the patient was awakened from general anesthesia and transferred to the recovery room in stable condition. At the end of the procedure all sponge, needle and instrument counts were correct.    PLAN OF CARE: Admit for overnight observation  PATIENT DISPOSITION:  PACU - hemodynamically stable.   Delay start of Pharmacological VTE agent (>24hrs) due to surgical blood loss or risk of bleeding:  yes

## 2019-11-04 NOTE — Discharge Summary (Signed)
Physician Discharge Summary  Patient ID: Samantha Dennis MRN: [id number] DOB/AGE: 09/06/1933 86 y.o.  Admit date: 11/04/2019 Discharge date: 11/04/2019  Admission Diagnoses: lumbar synovial cyst, stenosis, radiculopathy    Discharge Diagnoses: same   Discharged Condition: good  Hospital Course: The patient was admitted on 11/04/2019 and taken to the operating room where the patient underwent laminectomy for synovial cyst. The patient tolerated the procedure well and was taken to the recovery room and then to the floor in stable condition. The hospital course was routine. There were no complications. The wound remained clean dry and intact. Pt had appropriate back soreness. No complaints of leg pain or new N/T/W. The patient remained afebrile with stable vital signs, and tolerated a regular diet. The patient continued to increase activities, and pain was well controlled with oral pain medications.   Consults: None  Significant Diagnostic Studies:  Results for orders placed or performed during the hospital encounter of 11/02/19  SARS CORONAVIRUS 2 (TAT 6-24 HRS) Nasopharyngeal Nasopharyngeal Swab   Specimen: Nasopharyngeal Swab  Result Value Ref Range   SARS Coronavirus 2 NEGATIVE NEGATIVE    Chest 2 View  Result Date: 11/02/2019 CLINICAL DATA:  Preoperative evaluation, laminectomy, hypertension EXAM: CHEST - 2 VIEW COMPARISON:  None. FINDINGS: The heart size and mediastinal contours are within normal limits. Both lungs are clear. The visualized skeletal structures are unremarkable. IMPRESSION: No active cardiopulmonary disease. Electronically Signed   By: Randa Ngo M.D.   On: 11/02/2019 22:09    Antibiotics:  Anti-infectives (From admission, onward)   Start     Dose/Rate Route Frequency Ordered Stop   11/04/19 1700  ceFAZolin (ANCEF) IVPB 2g/100 mL premix     2 g 200 mL/hr over 30 Minutes Intravenous Every 8 hours 11/04/19 1154 11/05/19 0859   11/04/19 0924  bacitracin 50,000 Units  in sodium chloride 0.9 % 500 mL irrigation  Status:  Discontinued       As needed 11/04/19 0925 11/04/19 1019   11/04/19 0715  ceFAZolin (ANCEF) IVPB 2g/100 mL premix     2 g 200 mL/hr over 30 Minutes Intravenous On call to O.R. 11/04/19 0704 11/04/19 0920      Discharge Exam: Blood pressure 137/71, pulse 75, temperature 97.7 F (36.5 C), resp. rate 18, height 5\' 1"  (1.549 m), weight 60.8 kg, SpO2 100 %. Neurologic: Grossly normal Dressing dry  Discharge Medications:   Allergies as of 11/04/2019   No Known Allergies     Medication List    TAKE these medications   famciclovir 250 MG tablet Commonly known as: FAMVIR Take 1 tablet (250 mg total) by mouth daily.   HYDROcodone-acetaminophen 7.5-325 MG tablet Commonly known as: NORCO Take 1 tablet by mouth every 4 (four) hours as needed for moderate pain.   ibandronate 150 MG tablet Commonly known as: BONIVA TAKE 1 TABLET EVERY MONTH ON AN EMPTY STOMACH What changed: See the new instructions.   ibuprofen 600 MG tablet Commonly known as: ADVIL Take 1 tablet (600 mg total) by mouth every 8 (eight) hours as needed.   levothyroxine 75 MCG tablet Commonly known as: SYNTHROID Take 1 tablet (75 mcg total) by mouth daily before breakfast.   lisinopril-hydrochlorothiazide 10-12.5 MG tablet Commonly known as: ZESTORETIC Take 1 tablet by mouth daily.   Lutein 20 MG Tabs Take 20 mg by mouth daily.   MOVE FREE ULTRA JOINT HEALTH PO Take 1 tablet by mouth daily.   rosuvastatin 5 MG tablet Commonly known as: CRESTOR Take 1 tablet (5  mg total) by mouth daily.   Vitamin D 125 MCG (5000 UT) Caps Take 5,000 Units by mouth daily.       Disposition: home   Final Dx: laminectomy L4-5 for synovial cyst  Discharge Instructions     Remove dressing in 72 hours   Complete by: As directed    Call MD for:  difficulty breathing, headache or visual disturbances   Complete by: As directed    Call MD for:  persistant nausea and  vomiting   Complete by: As directed    Call MD for:  redness, tenderness, or signs of infection (pain, swelling, redness, odor or green/yellow discharge around incision site)   Complete by: As directed    Call MD for:  severe uncontrolled pain   Complete by: As directed    Call MD for:  temperature >100.4   Complete by: As directed    Diet - low sodium heart healthy   Complete by: As directed    Increase activity slowly   Complete by: As directed       Follow-up Information    Eustace Moore, MD. Schedule an appointment as soon as possible for a visit in 2 week(s).   Specialty: Neurosurgery Contact information: [address] [place] [address] [place] [address] [phone]            Signed: Eustace Moore 11/04/2019, 3:57 PM

## 2019-11-04 NOTE — Anesthesia Procedure Notes (Signed)
Procedure Name: Intubation Date/Time: 11/04/2019 8:47 AM Performed by: Trinna Post., CRNA Pre-anesthesia Checklist: Patient identified, Emergency Drugs available, Suction available, Patient being monitored and Timeout performed Patient Re-evaluated:Patient Re-evaluated prior to induction Oxygen Delivery Method: Circle system utilized Preoxygenation: Pre-oxygenation with 100% oxygen Induction Type: IV induction Ventilation: Mask ventilation without difficulty Laryngoscope Size: Mac and 3 Grade View: Grade II Tube type: Oral Tube size: 7.0 mm Number of attempts: 1 Airway Equipment and Method: Stylet Placement Confirmation: ETT inserted through vocal cords under direct vision,  positive ETCO2 and breath sounds checked- equal and bilateral Secured at: 22 cm Tube secured with: Tape Dental Injury: Teeth and Oropharynx as per pre-operative assessment

## 2019-11-04 NOTE — Anesthesia Postprocedure Evaluation (Signed)
Anesthesia Post Note  Patient: Samantha Dennis  Procedure(s) Performed: Laminectomy for facet/synovial cyst - Lumbar Four- Lumbar Five - right (Right Back)     Patient location during evaluation: PACU Anesthesia Type: General Level of consciousness: awake and alert Pain management: pain level controlled Vital Signs Assessment: post-procedure vital signs reviewed and stable Respiratory status: spontaneous breathing, nonlabored ventilation and respiratory function stable Cardiovascular status: blood pressure returned to baseline and stable Postop Assessment: no apparent nausea or vomiting Anesthetic complications: no    Last Vitals:  Vitals:   11/04/19 1130 11/04/19 1157  BP:  137/71  Pulse: 66 75  Resp: 17 18  Temp: 36.5 C   SpO2: 99% 100%    Last Pain:  Vitals:   11/04/19 1205  TempSrc:   PainSc: 4                  Lidia Collum

## 2019-11-04 NOTE — H&P (Signed)
Subjective: Patient is a 86 y.o. female admitted for right leg pain. Onset of symptoms was several months ago, gradually worsening since that time.  The pain is rated severe, and is located at the across the lower back and radiates to R leg. The pain is described as aching and occurs all day. The symptoms have been progressive. Symptoms are exacerbated by nothing in particular. MRI or CT showed synovial cyst L4-5 R   Past Medical History:  Diagnosis Date  . Hay fever   . Hypertension   . Hypothyroidism   . Osteoporosis   . UTI (urinary tract infection)    last one 06/2019    Past Surgical History:  Procedure Laterality Date  . TOTAL ABDOMINAL HYSTERECTOMY Bilateral 1984    Prior to Admission medications   Medication Sig Start Date End Date Taking? Authorizing Provider  Cholecalciferol (VITAMIN D) 125 MCG (5000 UT) CAPS Take 5,000 Units by mouth daily.   Yes [provider]  Collagen-Boron-Hyaluronic Acid (MOVE FREE ULTRA JOINT HEALTH PO) Take 1 tablet by mouth daily.   Yes [provider]  ibandronate (BONIVA) 150 MG tablet TAKE 1 TABLET EVERY MONTH ON AN EMPTY STOMACH Patient taking differently: Take 150 mg by mouth every 30 (thirty) days.  03/10/19  Yes [name], MD  ibuprofen (ADVIL) 600 MG tablet Take 1 tablet (600 mg total) by mouth every 8 (eight) hours as needed. 07/23/19  Yes Ronnell Freshwater, NP  levothyroxine (SYNTHROID) 75 MCG tablet Take 1 tablet (75 mcg total) by mouth daily before breakfast. 09/10/19  Yes Boscia, Heather E, NP  lisinopril-hydrochlorothiazide (ZESTORETIC) 10-12.5 MG tablet Take 1 tablet by mouth daily. 07/22/19  Yes Boscia, Heather E, NP  Lutein 20 MG TABS Take 20 mg by mouth daily.   Yes [provider]  rosuvastatin (CRESTOR) 5 MG tablet Take 1 tablet (5 mg total) by mouth daily. 07/30/19  Yes Ronnell Freshwater, NP  famciclovir (FAMVIR) 250 MG tablet Take 1 tablet (250 mg total) by mouth daily. Patient not taking: Reported on  10/21/2019 08/10/19   Ronnell Freshwater, NP   No Known Allergies  Social History   Tobacco Use  . Smoking status: Never Smoker  . Smokeless tobacco: Never Used  Substance Use Topics  . Alcohol use: Yes    Alcohol/week: 1.0 standard drinks    Types: 1 Glasses of wine per week    Comment: ocassionally     Family History  Problem Relation Age of Onset  . Breast cancer Neg Hx      Review of Systems  Positive ROS: neg  All other systems have been reviewed and were otherwise negative with the exception of those mentioned in the HPI and as above.  Objective: Vital signs in last 24 hours: Temp:  [96.9 F (36.1 C)] 96.9 F (36.1 C) (05/12 0640) Pulse Rate:  [70] 70 (05/12 0640) Resp:  [18] 18 (05/12 0640) BP: (172)/(68) 172/68 (05/12 0640) SpO2:  [100 %] 100 % (05/12 0640) Weight:  [60.8 kg] 60.8 kg (05/12 0640)  General Appearance: Alert, cooperative, no distress, appears stated age Head: Normocephalic, without obvious abnormality, atraumatic Eyes: PERRL, conjunctiva/corneas clear, EOM's intact    Neck: Supple, symmetrical, trachea midline Back: Symmetric, no curvature, ROM normal, no CVA tenderness Lungs:  respirations unlabored Heart: Regular rate and rhythm Abdomen: Soft, non-tender Extremities: Extremities normal, atraumatic, no cyanosis or edema Pulses: 2+ and symmetric all extremities Skin: Skin color, texture, turgor normal, no rashes or lesions  NEUROLOGIC:  Mental status: Alert and oriented x4,  no aphasia, good attention span, fund of knowledge, and memory Motor Exam - grossly normal Sensory Exam - grossly normal Reflexes: 1= Coordination - grossly normal Gait - grossly normal Balance - grossly normal Cranial Nerves: I: smell Not tested  II: visual acuity  OS: nl    OD: nl  II: visual fields Full to confrontation  II: pupils Equal, round, reactive to light  III,VII: ptosis None  III,IV,VI: extraocular muscles  Full ROM  V: mastication Normal  V:  facial light touch sensation  Normal  V,VII: corneal reflex  Present  VII: facial muscle function - upper  Normal  VII: facial muscle function - lower Normal  VIII: hearing Not tested  IX: soft palate elevation  Normal  IX,X: gag reflex Present  XI: trapezius strength  5/5  XI: sternocleidomastoid strength 5/5  XI: neck flexion strength  5/5  XII: tongue strength  Normal    Data Review Lab Results  Component Value Date   WBC 7.7 11/02/2019   HGB 14.7 11/02/2019   HCT 42.7 11/02/2019   MCV 94.3 11/02/2019   PLT 209 11/02/2019   Lab Results  Component Value Date   NA 139 11/02/2019   K 3.4 (L) 11/02/2019   CL 100 11/02/2019   CO2 27 11/02/2019   BUN 11 11/02/2019   CREATININE 0.63 11/02/2019   GLUCOSE 104 (H) 11/02/2019   Lab Results  Component Value Date   INR 1.0 11/02/2019    Assessment/Plan:  Estimated body mass index is 25.32 kg/m as calculated from the following:   Height as of this encounter: 5\' 1"  (1.549 m).   Weight as of this encounter: 60.8 kg. Patient admitted for R L4-5 hemilam for synovial cyst. Patient has failed a reasonable attempt at conservative therapy.  I explained the condition and procedure to the patient and answered any questions.  Patient wishes to proceed with procedure as planned. Understands risks/ benefits and typical outcomes of procedure.   Eustace Moore 11/04/2019 8:35 AM

## 2019-11-12 ENCOUNTER — Other Ambulatory Visit: Payer: Self-pay

## 2019-11-12 ENCOUNTER — Encounter: Payer: Self-pay | Admitting: Nurse Practitioner

## 2019-11-12 ENCOUNTER — Ambulatory Visit (INDEPENDENT_AMBULATORY_CARE_PROVIDER_SITE_OTHER): Payer: Medicare HMO | Admitting: Nurse Practitioner

## 2019-11-12 VITALS — BP 144/71 | HR 82 | Temp 97.4°F | Resp 16 | Ht 61.0 in | Wt 132.4 lb

## 2019-11-12 DIAGNOSIS — R300 Dysuria: Secondary | ICD-10-CM | POA: Diagnosis not present

## 2019-11-12 DIAGNOSIS — M5431 Sciatica, right side: Secondary | ICD-10-CM

## 2019-11-12 DIAGNOSIS — I10 Essential (primary) hypertension: Secondary | ICD-10-CM | POA: Diagnosis not present

## 2019-11-12 LAB — POCT URINALYSIS DIPSTICK
Blood, UA: NEGATIVE
Glucose, UA: NEGATIVE
Ketones, UA: NEGATIVE
Leukocytes, UA: NEGATIVE
Nitrite, UA: NEGATIVE
Protein, UA: POSITIVE — AB
Spec Grav, UA: 1.025 (ref 1.010–1.025)
Urobilinogen, UA: 0.2 E.U./dL
pH, UA: 6 (ref 5.0–8.0)

## 2019-11-12 NOTE — Progress Notes (Signed)
[facility] [place], [place] [address]  Internal MEDICINE  Office Visit Note  Patient Name: Samantha Dennis  [id number]  [id number]  Date of Service: 11/18/2019   Pt is here for a sick visit.  Chief Complaint  Patient presents with  . Urinary Tract Infection    no symptoms, used at-home test strips     The patient is here for acute visit. Used at-home testing kit to see if she may have urinary tract infection. Strip turned slightly pink. She was concerned that she was developing UTI. She was treated in January. 2021 for asymptomatic bacteriuria. This has scared her a little, thinking she may develop UTI without any symptoms. She denies dysuria, hematuria, flank pain,abdominal, or pelvic pain. She states that she has not symptoms of uti at all.  She had spinal surgery to remove cyst on her lumbar spine, causing sciatica one week ago. She states she has had very little pain in her back, and right leg pain is gradually improving. She will follow up with her surgeon next week.  Blood pressure is well managed.        Current Medication:  Outpatient Encounter Medications as of 11/12/2019  Medication Sig  . Cholecalciferol (VITAMIN D) 125 MCG (5000 UT) CAPS Take 5,000 Units by mouth daily.  . Collagen-Boron-Hyaluronic Acid (MOVE FREE ULTRA JOINT HEALTH PO) Take 1 tablet by mouth daily.  Marland Kitchen ibandronate (BONIVA) 150 MG tablet TAKE 1 TABLET EVERY MONTH ON AN EMPTY STOMACH (Patient taking differently: Take 150 mg by mouth every 30 (thirty) days. )  . ibuprofen (ADVIL) 600 MG tablet Take 1 tablet (600 mg total) by mouth every 8 (eight) hours as needed.  Marland Kitchen levothyroxine (SYNTHROID) 75 MCG tablet Take 1 tablet (75 mcg total) by mouth daily before breakfast.  . lisinopril-hydrochlorothiazide (ZESTORETIC) 10-12.5 MG tablet Take 1 tablet by mouth daily.  . Lutein 20 MG TABS Take 20 mg by mouth daily.  . rosuvastatin (CRESTOR) 5 MG tablet Take 1 tablet (5 mg total) by mouth  daily.  . [DISCONTINUED] famciclovir (FAMVIR) 250 MG tablet Take 1 tablet (250 mg total) by mouth daily. (Patient not taking: Reported on 10/21/2019)  . [DISCONTINUED] HYDROcodone-acetaminophen (NORCO) 7.5-325 MG tablet Take 1 tablet by mouth every 4 (four) hours as needed for moderate pain. (Patient not taking: Reported on 11/12/2019)   No facility-administered encounter medications on file as of 11/12/2019.      Medical History: Past Medical History:  Diagnosis Date  . Hay fever   . Hypertension   . Hypothyroidism   . Osteoporosis   . UTI (urinary tract infection)    last one 06/2019     Today's Vitals   11/12/19 1410  BP: (!) 144/71  Pulse: 82  Resp: 16  Temp: (!) 97.4 F (36.3 C)  SpO2: 98%  Weight: 132 lb 6.4 oz (60.1 kg)  Height: 5' 1"  (1.549 m)   Body mass index is 25.02 kg/m.  Review of Systems  Constitutional: Negative for activity change, chills, fatigue and unexpected weight change.  HENT: Negative for congestion, postnasal drip, rhinorrhea, sneezing and sore throat.   Respiratory: Negative for cough, chest tightness, shortness of breath and wheezing.   Cardiovascular: Negative for chest pain and palpitations.       Well managed blood pressure.   Gastrointestinal: Negative for abdominal pain, constipation, diarrhea, nausea and vomiting.  Endocrine: Negative for cold intolerance, heat intolerance, polydipsia and polyuria.  Genitourinary: Negative for dysuria, frequency, hematuria and urgency.  Musculoskeletal: Positive for arthralgias, back pain and myalgias. Negative for joint swelling and neck pain.       Right sided sciatica persistent, however improved.  Skin: Negative for rash.  Allergic/Immunologic: Negative for environmental allergies.  Neurological: Negative for dizziness, tremors, numbness and headaches.  Hematological: Negative for adenopathy. Does not bruise/bleed easily.  Psychiatric/Behavioral: Negative for behavioral problems (Depression), sleep  disturbance and suicidal ideas. The patient is nervous/anxious.     Physical Exam Vitals and nursing note reviewed.  Constitutional:      General: She is not in acute distress.    Appearance: Normal appearance. She is well-developed. She is not diaphoretic.  HENT:     Head: Normocephalic and atraumatic.     Mouth/Throat:     Pharynx: No oropharyngeal exudate.  Eyes:     Pupils: Pupils are equal, round, and reactive to light.  Neck:     Thyroid: No thyromegaly.     Vascular: No carotid bruit or JVD.     Trachea: No tracheal deviation.  Cardiovascular:     Rate and Rhythm: Normal rate and regular rhythm.     Heart sounds: Normal heart sounds. No murmur. No friction rub. No gallop.   Pulmonary:     Effort: Pulmonary effort is normal. No respiratory distress.     Breath sounds: Normal breath sounds. No wheezing or rales.  Chest:     Chest wall: No tenderness.  Abdominal:     Palpations: Abdomen is soft.  Genitourinary:    Comments: Urine sample positive for protein. No evidence on infection or other abnormalities.  Musculoskeletal:        General: Normal range of motion.     Cervical back: Normal range of motion and neck supple.  Lymphadenopathy:     Cervical: No cervical adenopathy.  Skin:    General: Skin is warm and dry.  Neurological:     Mental Status: She is alert and oriented to person, place, and time.     Cranial Nerves: No cranial nerve deficit.  Psychiatric:        Attention and Perception: Attention and perception normal.        Mood and Affect: Affect normal. Mood is anxious.        Speech: Speech normal.        Behavior: Behavior normal. Behavior is cooperative.        Thought Content: Thought content normal.        Cognition and Memory: Cognition and memory normal.        Judgment: Judgment normal.   Assessment/Plan: 1. Dysuria Urine sample positive for protein. No evidence on infection or other abnormalities.  Encouraged her to increase water intake.  Will send urine sample for culture and sensitivity and treat with antibiotics as indicated.  - POCT Urinalysis Dipstick - CULTURE, URINE COMPREHENSIVE  2. Essential hypertension, benign Stable. Continue bp medication as prescribed   3. Right sided sciatica conitnue physical therapy as ordered per surgery.   General Counseling: tricha ruggirello understanding of the findings of todays visit and agrees with plan of treatment. I have discussed any further diagnostic evaluation that may be needed or ordered today. We also reviewed her medications today. she has been encouraged to call the office with any questions or concerns that should arise related to todays visit.    Counseling:  This patient was seen by Leretha Pol FNP Collaboration with Dr [name] as a part of collaborative care agreement  Orders Placed This Encounter  Procedures  . CULTURE, URINE COMPREHENSIVE  . POCT Urinalysis Dipstick     Time spent: 25 Minutes

## 2019-11-13 DIAGNOSIS — M1712 Unilateral primary osteoarthritis, left knee: Secondary | ICD-10-CM | POA: Diagnosis not present

## 2019-11-15 NOTE — Progress Notes (Signed)
No antibiotic treatment ordered at time of visit.

## 2019-11-16 LAB — CULTURE, URINE COMPREHENSIVE

## 2019-12-16 ENCOUNTER — Other Ambulatory Visit: Payer: Self-pay

## 2019-12-16 MED ORDER — ROSUVASTATIN CALCIUM 5 MG PO TABS: 5.0000 mg | ORAL_TABLET | Freq: Every day | ORAL | 1 refills | Status: DC

## 2019-12-16 MED ORDER — LISINOPRIL-HYDROCHLOROTHIAZIDE 10-12.5 MG PO TABS: 1.0000 | ORAL_TABLET | Freq: Every day | ORAL | 1 refills | Status: DC

## 2019-12-31 ENCOUNTER — Telehealth: Payer: Self-pay

## 2019-12-31 NOTE — Telephone Encounter (Signed)
Lmom to confirm and screen for 01-04-20 ov.

## 2020-01-04 ENCOUNTER — Encounter: Payer: Self-pay | Admitting: Nurse Practitioner

## 2020-01-04 ENCOUNTER — Other Ambulatory Visit: Payer: Self-pay

## 2020-01-04 ENCOUNTER — Ambulatory Visit (INDEPENDENT_AMBULATORY_CARE_PROVIDER_SITE_OTHER): Payer: Medicare HMO | Admitting: Nurse Practitioner

## 2020-01-04 VITALS — BP 132/70 | HR 77 | Temp 97.7°F | Resp 16 | Ht 61.0 in | Wt 132.4 lb

## 2020-01-04 DIAGNOSIS — E782 Mixed hyperlipidemia: Secondary | ICD-10-CM

## 2020-01-04 DIAGNOSIS — E039 Hypothyroidism, unspecified: Secondary | ICD-10-CM

## 2020-01-04 DIAGNOSIS — I10 Essential (primary) hypertension: Secondary | ICD-10-CM

## 2020-01-04 NOTE — Progress Notes (Signed)
[facility] [place], [place] [address]  Internal MEDICINE  Office Visit Note  Patient Name: Samantha Dennis  [id number]  [id number]  Date of Service: 01/10/2020  Chief Complaint  Patient presents with  . Follow-up  . Hypertension  . Quality Metric Gaps    TDAP    The patient is here for routine follow up visit. States that she feels as though her blood pressure has been elevated despite taking medication as prescribed . Today, her blood pressure was slightly elevated upon arrival, but came down to within normal limits by the end of visit. She denies chest pain, chest pressure, shortness of breath, or headaches. She has no new concerns or complaints today.      Current Medication: Outpatient Encounter Medications as of 01/04/2020  Medication Sig  . Cholecalciferol (VITAMIN D) 125 MCG (5000 UT) CAPS Take 5,000 Units by mouth daily.  . Collagen-Boron-Hyaluronic Acid (MOVE FREE ULTRA JOINT HEALTH PO) Take 1 tablet by mouth daily.  Marland Kitchen ibandronate (BONIVA) 150 MG tablet TAKE 1 TABLET EVERY MONTH ON AN EMPTY STOMACH (Patient taking differently: Take 150 mg by mouth every 30 (thirty) days. )  . ibuprofen (ADVIL) 600 MG tablet Take 1 tablet (600 mg total) by mouth every 8 (eight) hours as needed.  Marland Kitchen levothyroxine (SYNTHROID) 75 MCG tablet Take 1 tablet (75 mcg total) by mouth daily before breakfast.  . lisinopril-hydrochlorothiazide (ZESTORETIC) 10-12.5 MG tablet Take 1 tablet by mouth daily.  . Lutein 20 MG TABS Take 20 mg by mouth daily.  . rosuvastatin (CRESTOR) 5 MG tablet Take 1 tablet (5 mg total) by mouth daily.   No facility-administered encounter medications on file as of 01/04/2020.    Surgical History: Past Surgical History:  Procedure Laterality Date  . LUMBAR LAMINECTOMY/DECOMPRESSION MICRODISCECTOMY Right 11/04/2019   Procedure: Laminectomy for facet/synovial cyst - Lumbar Four- Lumbar Five - right;  Surgeon: Eustace Moore, MD;  Location: [facility];   Service: Neurosurgery;  Laterality: Right;  Laminectomy for facet/synovial cyst - Lumbar Four- Lumbar Five - right  . TOTAL ABDOMINAL HYSTERECTOMY Bilateral 1984    Medical History: Past Medical History:  Diagnosis Date  . Hay fever   . Hypertension   . Hypothyroidism   . Osteoporosis   . UTI (urinary tract infection)    last one 06/2019    Family History: Family History  Problem Relation Age of Onset  . Breast cancer Neg Hx     Social History   Socioeconomic History  . Marital status: Married    Spouse name: Not on file  . Number of children: Not on file  . Years of education: Not on file  . Highest education level: Not on file  Occupational History  . Not on file  Tobacco Use  . Smoking status: Never Smoker  . Smokeless tobacco: Never Used  Vaping Use  . Vaping Use: Never used  Substance and Sexual Activity  . Alcohol use: Yes    Alcohol/week: 1.0 standard drink    Types: 1 Glasses of wine per week    Comment: ocassionally   . Drug use: No  . Sexual activity: Not on file  Other Topics Concern  . Not on file  Social History Narrative  . Not on file   Social Determinants of Health   Financial Resource Strain:   . Difficulty of Paying Living Expenses:   Food Insecurity:   . Worried About Charity fundraiser in the Last Year:   .  Ran Out of Food in the Last Year:   Transportation Needs:   . Film/video editor (Medical):   Marland Kitchen Lack of Transportation (Non-Medical):   Physical Activity:   . Days of Exercise per Week:   . Minutes of Exercise per Session:   Stress:   . Feeling of Stress :   Social Connections:   . Frequency of Communication with Friends and Family:   . Frequency of Social Gatherings with Friends and Family:   . Attends Religious Services:   . Active Member of Clubs or Organizations:   . Attends Archivist Meetings:   Marland Kitchen Marital Status:   Intimate Partner Violence:   . Fear of Current or Ex-Partner:   . Emotionally Abused:    Marland Kitchen Physically Abused:   . Sexually Abused:       Review of Systems  Constitutional: Negative for activity change, chills, fatigue and unexpected weight change.  HENT: Negative for congestion, postnasal drip, rhinorrhea, sneezing and sore throat.   Respiratory: Negative for cough, chest tightness, shortness of breath and wheezing.   Cardiovascular: Negative for chest pain and palpitations.       Well managed blood pressure.   Gastrointestinal: Negative for abdominal pain, constipation, diarrhea, nausea and vomiting.  Endocrine: Negative for cold intolerance, heat intolerance, polydipsia and polyuria.  Genitourinary: Negative for dysuria, frequency, hematuria and urgency.  Musculoskeletal: Negative for arthralgias, back pain, joint swelling, myalgias and neck pain.  Skin: Negative for rash.  Allergic/Immunologic: Negative for environmental allergies.  Neurological: Negative for dizziness, tremors, numbness and headaches.  Hematological: Negative for adenopathy. Does not bruise/bleed easily.  Psychiatric/Behavioral: Negative for behavioral problems (Depression), sleep disturbance and suicidal ideas. The patient is nervous/anxious.    Today's Vitals   01/04/20 1135  BP: 132/70  Pulse: 77  Resp: 16  Temp: 97.7 F (36.5 C)  SpO2: 98%  Weight: 132 lb 6.4 oz (60.1 kg)  Height: 5\' 1"  (1.549 m)   Body mass index is 25.02 kg/m.  Physical Exam Vitals and nursing note reviewed.  Constitutional:      General: She is not in acute distress.    Appearance: Normal appearance. She is well-developed. She is not diaphoretic.  HENT:     Head: Normocephalic and atraumatic.     Mouth/Throat:     Pharynx: No oropharyngeal exudate.  Eyes:     Pupils: Pupils are equal, round, and reactive to light.  Neck:     Thyroid: No thyromegaly.     Vascular: No carotid bruit or JVD.     Trachea: No tracheal deviation.  Cardiovascular:     Rate and Rhythm: Normal rate and regular rhythm.     Heart  sounds: Normal heart sounds. No murmur heard.  No friction rub. No gallop.   Pulmonary:     Effort: Pulmonary effort is normal. No respiratory distress.     Breath sounds: Normal breath sounds. No wheezing or rales.  Chest:     Chest wall: No tenderness.  Abdominal:     Palpations: Abdomen is soft.  Musculoskeletal:        General: Normal range of motion.     Cervical back: Normal range of motion and neck supple.  Lymphadenopathy:     Cervical: No cervical adenopathy.  Skin:    General: Skin is warm and dry.  Neurological:     Mental Status: She is alert and oriented to person, place, and time.     Cranial Nerves: No cranial nerve deficit.  Psychiatric:        Attention and Perception: Attention and perception normal.        Mood and Affect: Affect normal. Mood is anxious.        Speech: Speech normal.        Behavior: Behavior normal. Behavior is cooperative.        Thought Content: Thought content normal.        Cognition and Memory: Cognition and memory normal.        Judgment: Judgment normal.    Assessment/Plan: 1. Essential hypertension, benign Stable. Continue bp medication as prescribed.   2. Mixed hyperlipidemia Continue rosuvastatin as prescribed   3. Acquired hypothyroidism Continue levothyroxine as prescribed   General Counseling: Ambria verbalizes understanding of the findings of todays visit and agrees with plan of treatment. I have discussed any further diagnostic evaluation that may be needed or ordered today. We also reviewed her medications today. she has been encouraged to call the office with any questions or concerns that should arise related to todays visit.   Hypertension Counseling:   The following hypertensive lifestyle modification were recommended and discussed:  1. Limiting alcohol intake to less than 1 oz/day of ethanol:(24 oz of beer or 8 oz of wine or 2 oz of 100-proof whiskey). 2. Take baby ASA 81 mg daily. 3. Importance of regular aerobic  exercise and losing weight. 4. Reduce dietary saturated fat and cholesterol intake for overall cardiovascular health. 5. Maintaining adequate dietary potassium, calcium, and magnesium intake. 6. Regular monitoring of the blood pressure. 7. Reduce sodium intake to less than 100 mmol/day (less than 2.3 gm of sodium or less than 6 gm of sodium choride)   This patient was seen by [place] with Dr [name] as a part of collaborative care agreement   Total time spent: 20 Minutes   Time spent includes review of chart, medications, test results, and follow up plan with the patient.      Dr [name] Internal medicine

## 2020-01-05 ENCOUNTER — Other Ambulatory Visit: Payer: Self-pay | Admitting: Nurse Practitioner

## 2020-01-05 DIAGNOSIS — E039 Hypothyroidism, unspecified: Secondary | ICD-10-CM | POA: Diagnosis not present

## 2020-01-06 LAB — CBC
Hematocrit: 40.9 % (ref 34.0–46.6)
Hemoglobin: 13.8 g/dL (ref 11.1–15.9)
MCH: 31.5 pg (ref 26.6–33.0)
MCHC: 33.7 g/dL (ref 31.5–35.7)
MCV: 93 fL (ref 79–97)
Platelets: 237 10*3/uL (ref 150–450)
RBC: 4.38 x10E6/uL (ref 3.77–5.28)
RDW: 12.8 % (ref 11.7–15.4)
WBC: 7.8 10*3/uL (ref 3.4–10.8)

## 2020-01-06 LAB — TSH: TSH: 2.16 u[IU]/mL (ref 0.450–4.500)

## 2020-01-06 LAB — T4, FREE: Free T4: 1.59 ng/dL (ref 0.82–1.77)

## 2020-01-10 DIAGNOSIS — E039 Hypothyroidism, unspecified: Secondary | ICD-10-CM | POA: Insufficient documentation

## 2020-01-10 NOTE — Progress Notes (Signed)
Please let the patient know that all labs looked good. Thank.s

## 2020-01-28 ENCOUNTER — Other Ambulatory Visit: Payer: Self-pay

## 2020-01-28 MED ORDER — FAMCICLOVIR 250 MG PO TABS: 250.0000 mg | ORAL_TABLET | Freq: Every day | ORAL | 1 refills | Status: DC

## 2020-03-09 ENCOUNTER — Other Ambulatory Visit: Payer: Self-pay | Admitting: Internal Medicine

## 2020-03-14 DIAGNOSIS — Z03818 Encounter for observation for suspected exposure to other biological agents ruled out: Secondary | ICD-10-CM | POA: Diagnosis not present

## 2020-03-14 DIAGNOSIS — R509 Fever, unspecified: Secondary | ICD-10-CM | POA: Diagnosis not present

## 2020-03-14 DIAGNOSIS — R519 Headache, unspecified: Secondary | ICD-10-CM | POA: Diagnosis not present

## 2020-03-14 DIAGNOSIS — Z20822 Contact with and (suspected) exposure to covid-19: Secondary | ICD-10-CM | POA: Diagnosis not present

## 2020-06-09 ENCOUNTER — Other Ambulatory Visit: Payer: Self-pay

## 2020-06-09 MED ORDER — LISINOPRIL-HYDROCHLOROTHIAZIDE 10-12.5 MG PO TABS: 1.0000 | ORAL_TABLET | Freq: Every day | ORAL | 1 refills | Status: DC

## 2020-06-09 MED ORDER — ROSUVASTATIN CALCIUM 5 MG PO TABS: 5.0000 mg | ORAL_TABLET | Freq: Every day | ORAL | 1 refills | Status: DC

## 2020-06-16 ENCOUNTER — Other Ambulatory Visit: Payer: Self-pay

## 2020-06-16 MED ORDER — LEVOTHYROXINE SODIUM 75 MCG PO TABS: 75.0000 ug | ORAL_TABLET | Freq: Every day | ORAL | 1 refills | Status: DC

## 2020-07-25 ENCOUNTER — Ambulatory Visit: Payer: Medicare HMO | Admitting: Hospice and Palliative Medicine

## 2020-07-27 ENCOUNTER — Encounter: Payer: Self-pay | Admitting: Hospice and Palliative Medicine

## 2020-07-27 ENCOUNTER — Ambulatory Visit (INDEPENDENT_AMBULATORY_CARE_PROVIDER_SITE_OTHER): Payer: Medicare HMO | Admitting: Hospice and Palliative Medicine

## 2020-07-27 VITALS — BP 138/84 | HR 61 | Temp 97.6°F | Resp 16 | Ht 61.0 in | Wt 133.6 lb

## 2020-07-27 DIAGNOSIS — M810 Age-related osteoporosis without current pathological fracture: Secondary | ICD-10-CM | POA: Diagnosis not present

## 2020-07-27 DIAGNOSIS — Z1231 Encounter for screening mammogram for malignant neoplasm of breast: Secondary | ICD-10-CM | POA: Diagnosis not present

## 2020-07-27 DIAGNOSIS — I10 Essential (primary) hypertension: Secondary | ICD-10-CM | POA: Diagnosis not present

## 2020-07-27 DIAGNOSIS — R300 Dysuria: Secondary | ICD-10-CM

## 2020-07-27 DIAGNOSIS — Z0001 Encounter for general adult medical examination with abnormal findings: Secondary | ICD-10-CM | POA: Diagnosis not present

## 2020-07-27 NOTE — Progress Notes (Signed)
[facility] [place], [place] [address]  Internal MEDICINE  Office Visit Note  Patient Name: Samantha Dennis  [id number]  [id number]  Date of Service: 07/30/2020  Chief Complaint  Patient presents with  . Medicare Wellness  . Hypertension     HPI Pt is here for routine health maintenance examination Overall, thing have been going well HTN-occasionally monitors his BP at home--has been stable Low back pain and sciatica pain has been well controlled No acute issues to discuss today  Continues to sleep well at night, no recent changes in her appetite or bowel habits  PHM Needs mammogram and BMD No longer having colonoscopy screening  Current Medication: Outpatient Encounter Medications as of 07/27/2020  Medication Sig  . Cholecalciferol (VITAMIN D) 125 MCG (5000 UT) CAPS Take 5,000 Units by mouth daily.  . Collagen-Boron-Hyaluronic Acid (MOVE FREE ULTRA JOINT HEALTH PO) Take 1 tablet by mouth daily.  . famciclovir (FAMVIR) 250 MG tablet Take 1 tablet (250 mg total) by mouth daily.  Marland Kitchen ibandronate (BONIVA) 150 MG tablet TAKE 1 TABLET EVERY MONTH ON AN EMPTY STOMACH  . ibuprofen (ADVIL) 600 MG tablet Take 1 tablet (600 mg total) by mouth every 8 (eight) hours as needed.  Marland Kitchen levothyroxine (SYNTHROID) 75 MCG tablet Take 1 tablet (75 mcg total) by mouth daily before breakfast.  . lisinopril-hydrochlorothiazide (ZESTORETIC) 10-12.5 MG tablet Take 1 tablet by mouth daily.  . Lutein 20 MG TABS Take 20 mg by mouth daily.  . rosuvastatin (CRESTOR) 5 MG tablet Take 1 tablet (5 mg total) by mouth daily.   No facility-administered encounter medications on file as of 07/27/2020.    Surgical History: Past Surgical History:  Procedure Laterality Date  . LUMBAR LAMINECTOMY/DECOMPRESSION MICRODISCECTOMY Right 11/04/2019   Procedure: Laminectomy for facet/synovial cyst - Lumbar Four- Lumbar Five - right;  Surgeon: Eustace Moore, MD;  Location: [facility];  Service:  Neurosurgery;  Laterality: Right;  Laminectomy for facet/synovial cyst - Lumbar Four- Lumbar Five - right  . TOTAL ABDOMINAL HYSTERECTOMY Bilateral 1984    Medical History: Past Medical History:  Diagnosis Date  . Hay fever   . Hypertension   . Hypothyroidism   . Osteoporosis   . UTI (urinary tract infection)    last one 06/2019    Family History: Family History  Problem Relation Age of Onset  . Breast cancer Neg Hx       Review of Systems  Constitutional: Negative for chills, diaphoresis and fatigue.  HENT: Negative for ear pain, postnasal drip and sinus pressure.   Eyes: Negative for photophobia, discharge, redness, itching and visual disturbance.  Respiratory: Negative for cough, shortness of breath and wheezing.   Cardiovascular: Negative for chest pain, palpitations and leg swelling.  Gastrointestinal: Negative for abdominal pain, constipation, diarrhea, nausea and vomiting.  Genitourinary: Negative for dysuria and flank pain.  Musculoskeletal: Negative for arthralgias, back pain, gait problem and neck pain.  Skin: Negative for color change.  Allergic/Immunologic: Negative for environmental allergies and food allergies.  Neurological: Negative for dizziness and headaches.  Hematological: Does not bruise/bleed easily.  Psychiatric/Behavioral: Negative for agitation, behavioral problems (depression) and hallucinations.     Vital Signs: BP 138/84   Pulse 61   Temp 97.6 F (36.4 C)   Resp 16   Ht 5\' 1"  (1.549 m)   Wt 133 lb 9.6 oz (60.6 kg)   SpO2 100%   BMI 25.24 kg/m    Physical Exam Vitals reviewed.  Constitutional:  Appearance: Normal appearance. She is normal weight.  HENT:     Right Ear: Tympanic membrane normal.     Left Ear: Tympanic membrane normal.     Nose: Nose normal.  Cardiovascular:     Rate and Rhythm: Normal rate and regular rhythm.     Pulses: Normal pulses.     Heart sounds: Normal heart sounds.  Pulmonary:     Effort: Pulmonary  effort is normal.     Breath sounds: Normal breath sounds.  Chest:  Breasts:     Right: Normal.     Left: Normal.    Abdominal:     General: Abdomen is flat.     Palpations: Abdomen is soft.  Musculoskeletal:        General: Normal range of motion.     Cervical back: Normal range of motion.  Skin:    General: Skin is warm.  Neurological:     General: No focal deficit present.     Mental Status: She is alert and oriented to person, place, and time. Mental status is at baseline.  Psychiatric:        Mood and Affect: Mood normal.        Behavior: Behavior normal.        Thought Content: Thought content normal.        Judgment: Judgment normal.    LABS: Recent Results (from the past 2160 hour(s))  UA/M w/rflx Culture, Routine     Status: Abnormal   Collection Time: 07/27/20 11:24 AM   Specimen: Urine   Urine  Result Value Ref Range   Specific Gravity, UA 1.012 1.005 - 1.030   pH, UA 7.0 5.0 - 7.5   Color, UA Yellow Yellow   Appearance Ur Clear Clear   Leukocytes,UA Trace (A) Negative   Protein,UA Trace Negative/Trace   Glucose, UA Negative Negative   Ketones, UA Negative Negative   RBC, UA Negative Negative   Bilirubin, UA Negative Negative   Urobilinogen, Ur 0.2 0.2 - 1.0 mg/dL   Nitrite, UA Negative Negative   Microscopic Examination See below:     Comment: Microscopic was indicated and was performed.   Urinalysis Reflex Comment     Comment: This specimen has reflexed to a Urine Culture.  Microscopic Examination     Status: Abnormal   Collection Time: 07/27/20 11:24 AM   Urine  Result Value Ref Range   WBC, UA 0-5 0 - 5 /hpf   RBC 3-10 (A) 0 - 2 /hpf   Epithelial Cells (non renal) 0-10 0 - 10 /hpf   Casts None seen None seen /lpf   Bacteria, UA None seen None seen/Few  Urine Culture, Reflex     Status: None   Collection Time: 07/27/20 11:24 AM   Urine  Result Value Ref Range   Urine Culture, Routine Final report    Organism ID, Bacteria No growth       Assessment/Plan: 1. Encounter for routine adult health examination with abnormal findings Well appearing 87 year old female Orders placed to update PHM Order slip given for lab orders as she prefers to have labs drawn at hospital--will review  2. Encounter for screening mammogram for malignant neoplasm of breast - MM Digital Screening; Future  3. Age-related osteoporosis without current pathological fracture Will update BMD--discuss continuing therapy, risks vs benefits of medication - DG Bone Density; Future  4. Essential hypertension BP and HR well controlled on current therapy, continue with monitoring  5. Dysuria - UA/M w/rflx  Culture, Routine - Microscopic Examination - Urine Culture, Reflex  General Counseling: Shaleen verbalizes understanding of the findings of todays visit and agrees with plan of treatment. I have discussed any further diagnostic evaluation that may be needed or ordered today. We also reviewed her medications today. she has been encouraged to call the office with any questions or concerns that should arise related to todays visit.    Counseling:    Orders Placed This Encounter  Procedures  . Microscopic Examination  . Urine Culture, Reflex  . MM Digital Screening  . DG Bone Density  . UA/M w/rflx Culture, Routine   Total time spent: 30 Minutes  Time spent includes review of chart, medications, test results, and follow up plan with the patient.   This patient was seen by Theodoro Grist AGNP-C Collaboration with Dr [name] as a part of collaborative care agreement   Tanna Furry. [facility] Internal Medicine

## 2020-07-29 LAB — MICROSCOPIC EXAMINATION
Bacteria, UA: NONE SEEN
Casts: NONE SEEN /lpf

## 2020-07-29 LAB — UA/M W/RFLX CULTURE, ROUTINE
Bilirubin, UA: NEGATIVE
Glucose, UA: NEGATIVE
Ketones, UA: NEGATIVE
Nitrite, UA: NEGATIVE
RBC, UA: NEGATIVE
Specific Gravity, UA: 1.012 (ref 1.005–1.030)
Urobilinogen, Ur: 0.2 mg/dL (ref 0.2–1.0)
pH, UA: 7 (ref 5.0–7.5)

## 2020-07-29 LAB — URINE CULTURE, REFLEX: Organism ID, Bacteria: NO GROWTH

## 2020-07-30 ENCOUNTER — Encounter: Payer: Self-pay | Admitting: Hospice and Palliative Medicine

## 2020-08-04 ENCOUNTER — Other Ambulatory Visit: Payer: Self-pay

## 2020-08-04 MED ORDER — FAMCICLOVIR 250 MG PO TABS: 250.0000 mg | ORAL_TABLET | Freq: Every day | ORAL | 1 refills | Status: DC

## 2020-09-26 ENCOUNTER — Telehealth: Payer: Self-pay | Admitting: Internal Medicine

## 2020-09-26 NOTE — Progress Notes (Signed)
  Chronic Care Management   Outreach Note  09/26/2020 Name: Zacari Radick MRN: [id number] DOB: 1933-09-07  Referred by: [name], MD Reason for referral : No chief complaint on file.   An unsuccessful telephone outreach was attempted today. The patient was referred to the pharmacist for assistance with care management and care coordination.   Follow Up Plan:   Carley Perdue [employer] Scheduler

## 2020-10-17 DIAGNOSIS — D2271 Melanocytic nevi of right lower limb, including hip: Secondary | ICD-10-CM | POA: Diagnosis not present

## 2020-10-17 DIAGNOSIS — D2272 Melanocytic nevi of left lower limb, including hip: Secondary | ICD-10-CM | POA: Diagnosis not present

## 2020-10-17 DIAGNOSIS — D2262 Melanocytic nevi of left upper limb, including shoulder: Secondary | ICD-10-CM | POA: Diagnosis not present

## 2020-10-17 DIAGNOSIS — L821 Other seborrheic keratosis: Secondary | ICD-10-CM | POA: Diagnosis not present

## 2020-10-17 DIAGNOSIS — D225 Melanocytic nevi of trunk: Secondary | ICD-10-CM | POA: Diagnosis not present

## 2020-10-17 DIAGNOSIS — D2261 Melanocytic nevi of right upper limb, including shoulder: Secondary | ICD-10-CM | POA: Diagnosis not present

## 2020-10-21 ENCOUNTER — Telehealth: Payer: Self-pay | Admitting: Internal Medicine

## 2020-10-21 NOTE — Progress Notes (Signed)
  Chronic Care Management   Note  10/21/2020 Name: Heidee Audi MRN: [id number] DOB: 06-07-1934  Analee Montee is a 87 y.o. year old female who is a primary care patient of [name], MD. I reached out to Melina Copa by phone today in response to a referral sent by Ms. Leandria Thom's PCP, [name], MD.   Ms. Leder was given information about Chronic Care Management services today including:  1. CCM service includes personalized support from designated clinical staff supervised by her physician, including individualized plan of care and coordination with other care providers 2. 24/7 contact phone numbers for assistance for urgent and routine care needs. 3. Service will only be billed when office clinical staff spend 20 minutes or more in a month to coordinate care. 4. Only one practitioner may furnish and bill the service in a calendar month. 5. The patient may stop CCM services at any time (effective at the end of the month) by phone call to the office staff.   Patient did not agree to enrollment in care management services and does not wish to consider at this time.  Follow up plan:   Carley Perdue [employer] Scheduler

## 2020-12-12 ENCOUNTER — Other Ambulatory Visit: Payer: Self-pay | Admitting: Nurse Practitioner

## 2020-12-14 ENCOUNTER — Other Ambulatory Visit: Payer: Self-pay | Admitting: Internal Medicine

## 2021-01-02 ENCOUNTER — Other Ambulatory Visit: Payer: Self-pay | Admitting: Internal Medicine

## 2021-01-16 ENCOUNTER — Other Ambulatory Visit: Payer: Self-pay | Admitting: Hospice and Palliative Medicine

## 2021-01-16 DIAGNOSIS — Z0001 Encounter for general adult medical examination with abnormal findings: Secondary | ICD-10-CM | POA: Diagnosis not present

## 2021-01-16 DIAGNOSIS — R5383 Other fatigue: Secondary | ICD-10-CM | POA: Diagnosis not present

## 2021-01-16 DIAGNOSIS — E038 Other specified hypothyroidism: Secondary | ICD-10-CM | POA: Diagnosis not present

## 2021-01-16 DIAGNOSIS — R6889 Other general symptoms and signs: Secondary | ICD-10-CM | POA: Diagnosis not present

## 2021-01-17 LAB — LIPID PANEL WITH LDL/HDL RATIO
Cholesterol, Total: 178 mg/dL (ref 100–199)
HDL: 80 mg/dL (ref >39)
LDL Chol Calc (NIH): 83 mg/dL (ref 0–99)
LDL/HDL Ratio: 1 ratio (ref 0.0–3.2)
Triglycerides: 83 mg/dL (ref 0–149)
VLDL Cholesterol Cal: 15 mg/dL (ref 5–40)

## 2021-01-17 LAB — CBC WITH DIFFERENTIAL/PLATELET
Basophils Absolute: 0.1 10*3/uL (ref 0.0–0.2)
Basos: 1 %
EOS (ABSOLUTE): 0.1 10*3/uL (ref 0.0–0.4)
Eos: 2 %
Hematocrit: 45.8 % (ref 34.0–46.6)
Hemoglobin: 15.5 g/dL (ref 11.1–15.9)
Immature Grans (Abs): 0 10*3/uL (ref 0.0–0.1)
Immature Granulocytes: 0 %
Lymphocytes Absolute: 2.9 10*3/uL (ref 0.7–3.1)
Lymphs: 44 %
MCH: 31.6 pg (ref 26.6–33.0)
MCHC: 33.8 g/dL (ref 31.5–35.7)
MCV: 93 fL (ref 79–97)
Monocytes Absolute: 0.7 10*3/uL (ref 0.1–0.9)
Monocytes: 11 %
Neutrophils Absolute: 2.7 10*3/uL (ref 1.4–7.0)
Neutrophils: 42 %
Platelets: 174 10*3/uL (ref 150–450)
RBC: 4.91 x10E6/uL (ref 3.77–5.28)
RDW: 13.3 % (ref 11.7–15.4)
WBC: 6.4 10*3/uL (ref 3.4–10.8)

## 2021-01-17 LAB — COMPREHENSIVE METABOLIC PANEL
ALT: 21 IU/L (ref 0–32)
AST: 24 IU/L (ref 0–40)
Albumin/Globulin Ratio: 2 (ref 1.2–2.2)
Albumin: 4.4 g/dL (ref 3.6–4.6)
Alkaline Phosphatase: 47 IU/L (ref 44–121)
BUN/Creatinine Ratio: 25 (ref 12–28)
BUN: 17 mg/dL (ref 8–27)
Bilirubin Total: 0.8 mg/dL (ref 0.0–1.2)
CO2: 26 mmol/L (ref 20–29)
Calcium: 9.8 mg/dL (ref 8.7–10.3)
Chloride: 99 mmol/L (ref 96–106)
Creatinine, Ser: 0.67 mg/dL (ref 0.57–1.00)
Globulin, Total: 2.2 g/dL (ref 1.5–4.5)
Glucose: 98 mg/dL (ref 65–99)
Potassium: 4.3 mmol/L (ref 3.5–5.2)
Sodium: 138 mmol/L (ref 134–144)
Total Protein: 6.6 g/dL (ref 6.0–8.5)
eGFR: 86 mL/min/{1.73_m2} (ref >59)

## 2021-01-17 LAB — T4, FREE: Free T4: 1.54 ng/dL (ref 0.82–1.77)

## 2021-01-17 LAB — TSH: TSH: 3.18 u[IU]/mL (ref 0.450–4.500)

## 2021-01-24 ENCOUNTER — Encounter (INDEPENDENT_AMBULATORY_CARE_PROVIDER_SITE_OTHER): Payer: Self-pay

## 2021-01-24 ENCOUNTER — Other Ambulatory Visit: Payer: Self-pay

## 2021-01-24 ENCOUNTER — Ambulatory Visit (INDEPENDENT_AMBULATORY_CARE_PROVIDER_SITE_OTHER): Payer: Medicare HMO | Admitting: Nurse Practitioner

## 2021-01-24 ENCOUNTER — Encounter: Payer: Self-pay | Admitting: Nurse Practitioner

## 2021-01-24 VITALS — BP 130/70 | HR 70 | Temp 97.8°F | Resp 16 | Ht 61.0 in | Wt 132.0 lb

## 2021-01-24 DIAGNOSIS — I10 Essential (primary) hypertension: Secondary | ICD-10-CM

## 2021-01-24 DIAGNOSIS — E039 Hypothyroidism, unspecified: Secondary | ICD-10-CM

## 2021-01-24 DIAGNOSIS — M810 Age-related osteoporosis without current pathological fracture: Secondary | ICD-10-CM

## 2021-01-24 DIAGNOSIS — Z1231 Encounter for screening mammogram for malignant neoplasm of breast: Secondary | ICD-10-CM | POA: Diagnosis not present

## 2021-01-24 DIAGNOSIS — E782 Mixed hyperlipidemia: Secondary | ICD-10-CM | POA: Diagnosis not present

## 2021-01-24 NOTE — Progress Notes (Signed)
Samantha Dennis, [name]  Internal MEDICINE  Office Visit Note  Patient Name: Samantha Dennis  [id number]  [id number]  Date of Service: 01/24/2021  Chief Complaint  Patient presents with   Follow-up   Hypertension   Hypothyroidism    HPI Samantha Dennis presents for follow up visit for hypertension and hypothyroidism. She also has sesonal allergies, hysterectomy, osteoporosis and hyperlipidemia She also wants to discuss labs results from 01/16/21. Her TSH level was 3.18 wnl. She currently takes levothyroxine 75 mcg daily.  -blood pressure is 130/70 today, she is taking lisinopril-hydrochlorothiazide.  -she has hyperlipidemia and takes rosuvastatin.  Due for screening mammogram.   Current Medication: Outpatient Encounter Medications as of 01/24/2021  Medication Sig   Cholecalciferol (VITAMIN D) 125 MCG (5000 UT) CAPS Take 5,000 Units by mouth daily.   Collagen-Boron-Hyaluronic Acid (MOVE FREE ULTRA JOINT HEALTH PO) Take 1 tablet by mouth daily.   famciclovir (FAMVIR) 250 MG tablet Take 1 tablet (250 mg total) by mouth daily.   ibandronate (BONIVA) 150 MG tablet TAKE 1 TABLET EVERY MONTH ON AN EMPTY STOMACH. DO NOT LIE DOWN FOR AT LEAST 1 HOUR AFTER TAKING DOSE.   levothyroxine (SYNTHROID) 75 MCG tablet TAKE 1 TABLET EVERY DAY BEFORE BREAKFAST   lisinopril-hydrochlorothiazide (ZESTORETIC) 10-12.5 MG tablet Take 1 tablet by mouth daily.   Lutein 20 MG TABS Take 20 mg by mouth daily.   rosuvastatin (CRESTOR) 5 MG tablet Take 1 tablet (5 mg total) by mouth daily.   [DISCONTINUED] ibuprofen (ADVIL) 600 MG tablet Take 1 tablet (600 mg total) by mouth every 8 (eight) hours as needed.   No facility-administered encounter medications on file as of 01/24/2021.    Surgical History: Past Surgical History:  Procedure Laterality Date   LUMBAR LAMINECTOMY/DECOMPRESSION MICRODISCECTOMY Right 11/04/2019   Procedure: Laminectomy for facet/synovial cyst - Lumbar Four- Lumbar  Five - right;  Surgeon: Samantha Moore, MD;  Location: [facility];  Service: Neurosurgery;  Laterality: Right;  Laminectomy for facet/synovial cyst - Lumbar Four- Lumbar Five - right   TOTAL ABDOMINAL HYSTERECTOMY Bilateral 1984    Medical History: Past Medical History:  Diagnosis Date   Hay fever    Hypertension    Hypothyroidism    Osteoporosis    UTI (urinary tract infection)    last one 06/2019    Family History: Family History  Problem Relation Age of Onset   Breast cancer Neg Hx     Social History   Socioeconomic History   Marital status: Married    Spouse name: Not on file   Number of children: Not on file   Years of education: Not on file   Highest education level: Not on file  Occupational History   Not on file  Tobacco Use   Smoking status: Never   Smokeless tobacco: Never  Vaping Use   Vaping Use: Never used  Substance and Sexual Activity   Alcohol use: Yes    Alcohol/week: 1.0 standard drink    Types: 1 Glasses of wine per week    Comment: ocassionally    Drug use: No   Sexual activity: Not on file  Other Topics Concern   Not on file  Social History Narrative   Not on file   Social Determinants of Health   Financial Resource Strain: Not on file  Food Insecurity: Not on file  Transportation Needs: Not on file  Physical Activity: Not on file  Stress: Not on file  Social Connections: Not  on file  Intimate Partner Violence: Not on file      Review of Systems  Constitutional:  Negative for chills, fatigue and unexpected weight change.  HENT:  Negative for congestion, rhinorrhea, sneezing and sore throat.   Eyes:  Negative for redness.  Respiratory:  Negative for cough, chest tightness and shortness of breath.   Cardiovascular:  Negative for chest pain and palpitations.  Gastrointestinal:  Negative for abdominal pain, constipation, diarrhea, nausea and vomiting.  Genitourinary:  Negative for dysuria and frequency.  Musculoskeletal:  Negative for  arthralgias, back pain, joint swelling and neck pain.  Skin:  Negative for rash.  Neurological: Negative.  Negative for tremors and numbness.  Hematological:  Negative for adenopathy. Does not bruise/bleed easily.  Psychiatric/Behavioral:  Negative for behavioral problems (Depression), sleep disturbance and suicidal ideas. The patient is not nervous/anxious.    Vital Signs: BP 130/70   Pulse 70   Temp 97.8 F (36.6 C)   Resp 16   Ht '5\' 1"'$  (1.549 m)   Wt 132 lb (59.9 kg)   SpO2 95%   BMI 24.94 kg/m    Physical Exam Vitals reviewed.  Constitutional:      General: She is not in acute distress.    Appearance: Normal appearance. She is not ill-appearing.  HENT:     Head: Normocephalic and atraumatic.  Cardiovascular:     Rate and Rhythm: Normal rate and regular rhythm.  Pulmonary:     Effort: Pulmonary effort is normal. No respiratory distress.  Skin:    General: Skin is warm and dry.     Capillary Refill: Capillary refill takes less than 2 seconds.  Neurological:     Mental Status: She is alert and oriented to person, place, and time.  Psychiatric:        Mood and Affect: Mood normal.        Behavior: Behavior normal.       Assessment/Plan: 1. Acquired hypothyroidism Stable, taking levothyroxine.  2. Essential hypertension Stable, continue medication as prescribed.  3. Mixed hyperlipidemia Stable, taking rosuvastatin.   4. Age-related osteoporosis without current pathological fracture Bone density scan ordered.  - DG Bone Density; Future  5. Encounter for screening mammogram for malignant neoplasm of breast Routine screening mammogram ordered.  - MM Digital Screening; Future   General Counseling: Samantha Dennis understanding of the findings of todays visit and agrees with plan of treatment. I have discussed any further diagnostic evaluation that may be needed or ordered today. We also reviewed her medications today. she has been encouraged to call the office  with any questions or concerns that should arise related to todays visit.    Orders Placed This Encounter  Procedures   MM Digital Screening   DG Bone Density    No orders of the defined types were placed in this encounter.   Return in about 3 months (around 04/26/2021) for F/U, med refill, Zeniya Lapidus PCP.   Total time spent:30 Minutes Time spent includes review of chart, medications, test results, and follow up plan with the patient.   [place] Controlled Substance Database was reviewed by me.  This patient was seen by Jonetta Osgood, FNP-C in collaboration with Dr. Clayborn Bigness as a part of collaborative care agreement.   Meesha Sek R. Valetta Fuller, MSN, FNP-C Internal medicine

## 2021-02-13 ENCOUNTER — Other Ambulatory Visit: Payer: Self-pay | Admitting: Internal Medicine

## 2021-02-20 IMAGING — CR DG CHEST 2V
2 series · 2 of 2 positions shown · non-contrast
Comparison: None.

CLINICAL DATA: Preoperative evaluation, laminectomy, hypertension

EXAM:
CHEST - 2 VIEW

[w chest pa]
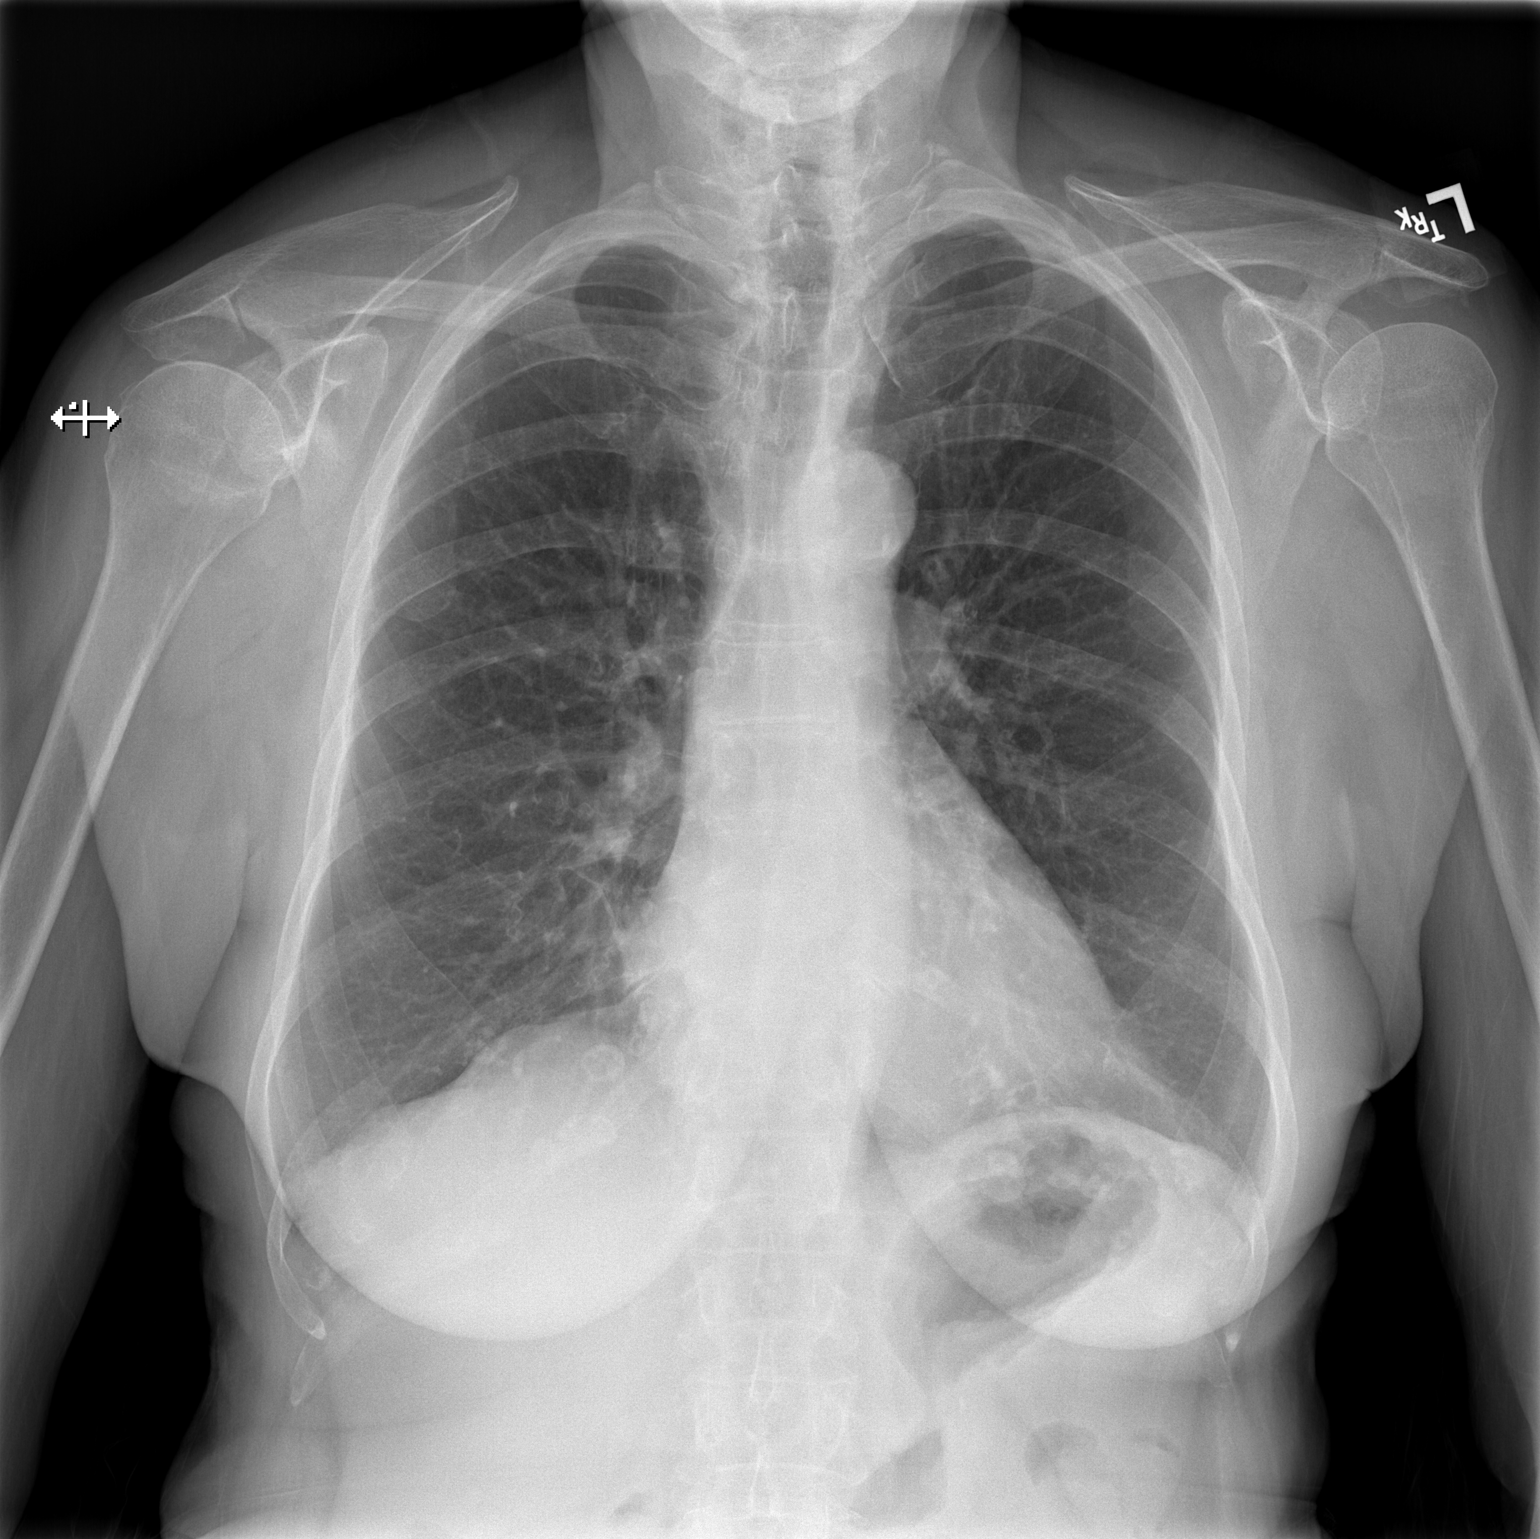

[w chest lat]
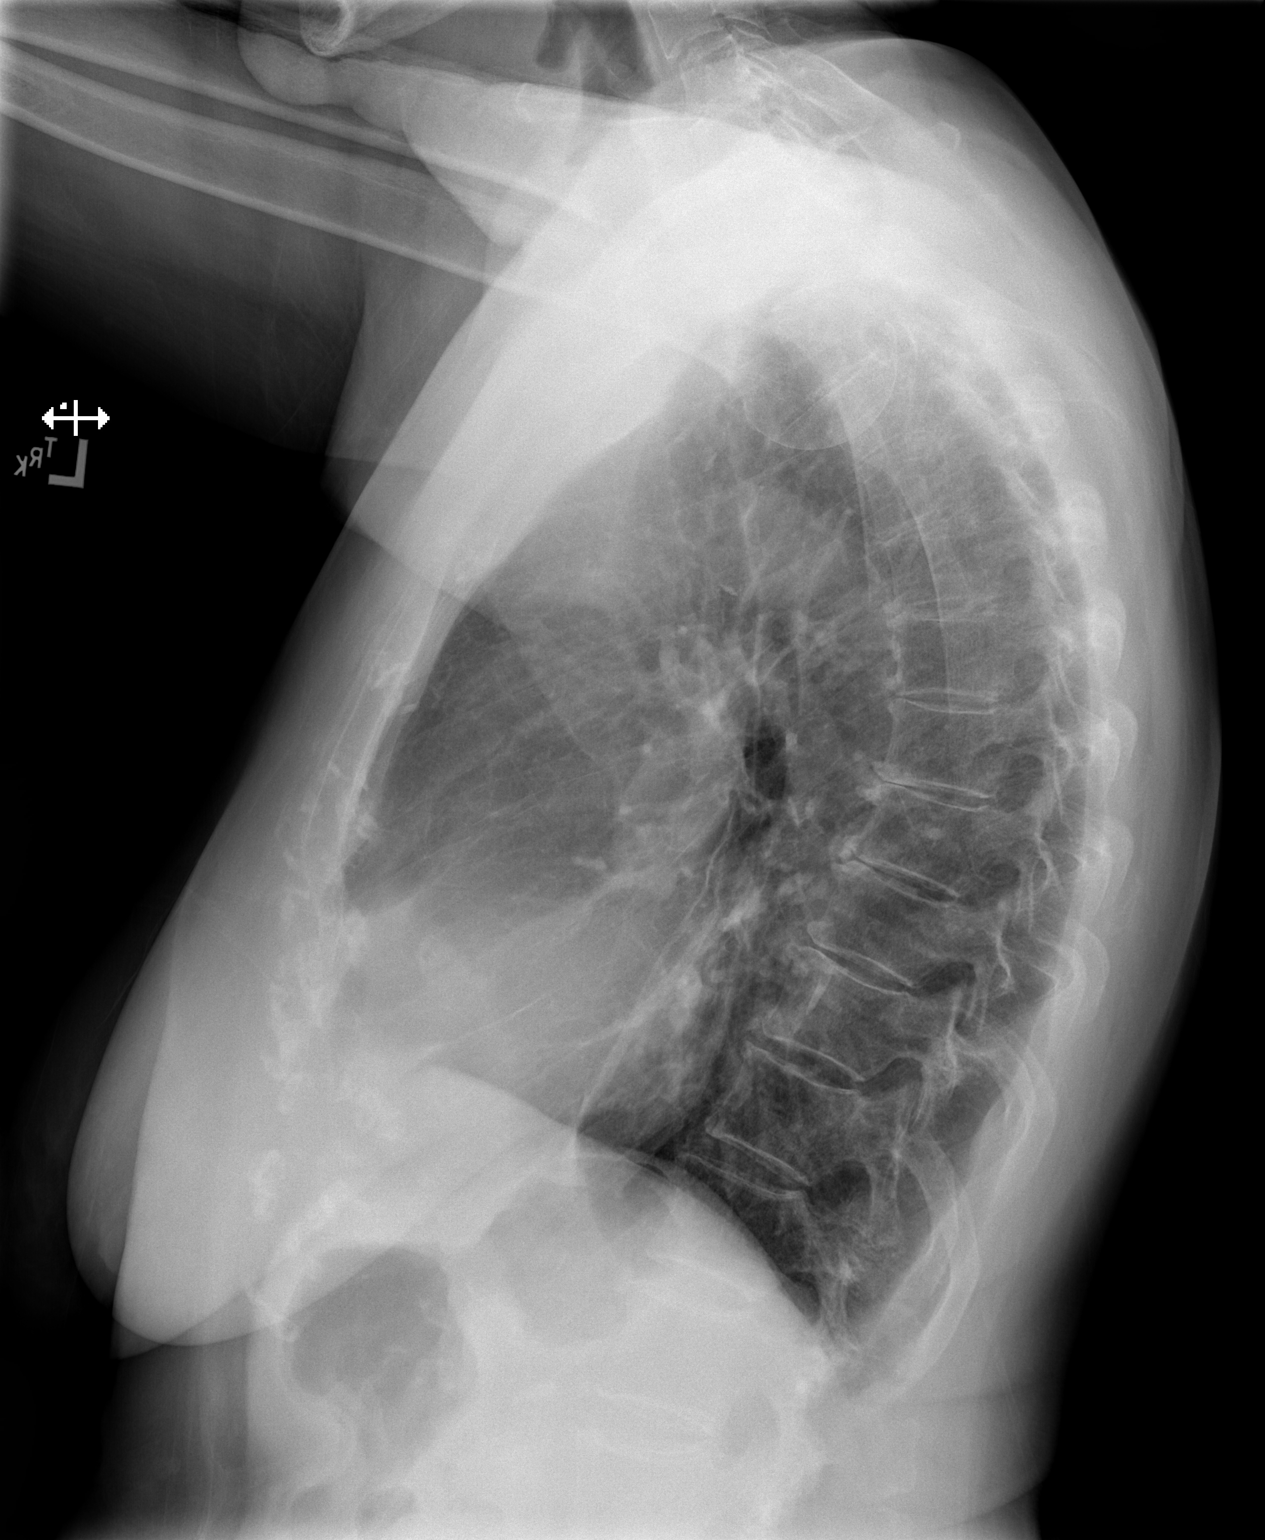

[2 of 2 positions shown; findings below may reference images not displayed]

FINDINGS: The heart size and mediastinal contours are within normal limits.
Both lungs are clear. The visualized skeletal structures are
unremarkable.
IMPRESSION: No active cardiopulmonary disease.

## 2021-02-22 IMAGING — CR DG LUMBAR SPINE 1V
1 series · 1 of 1 positions shown · non-contrast
Comparison: August 05, 2019.

CLINICAL DATA: L4-5 laminectomy.

EXAM:
LUMBAR SPINE - 1 VIEW

[lateral]
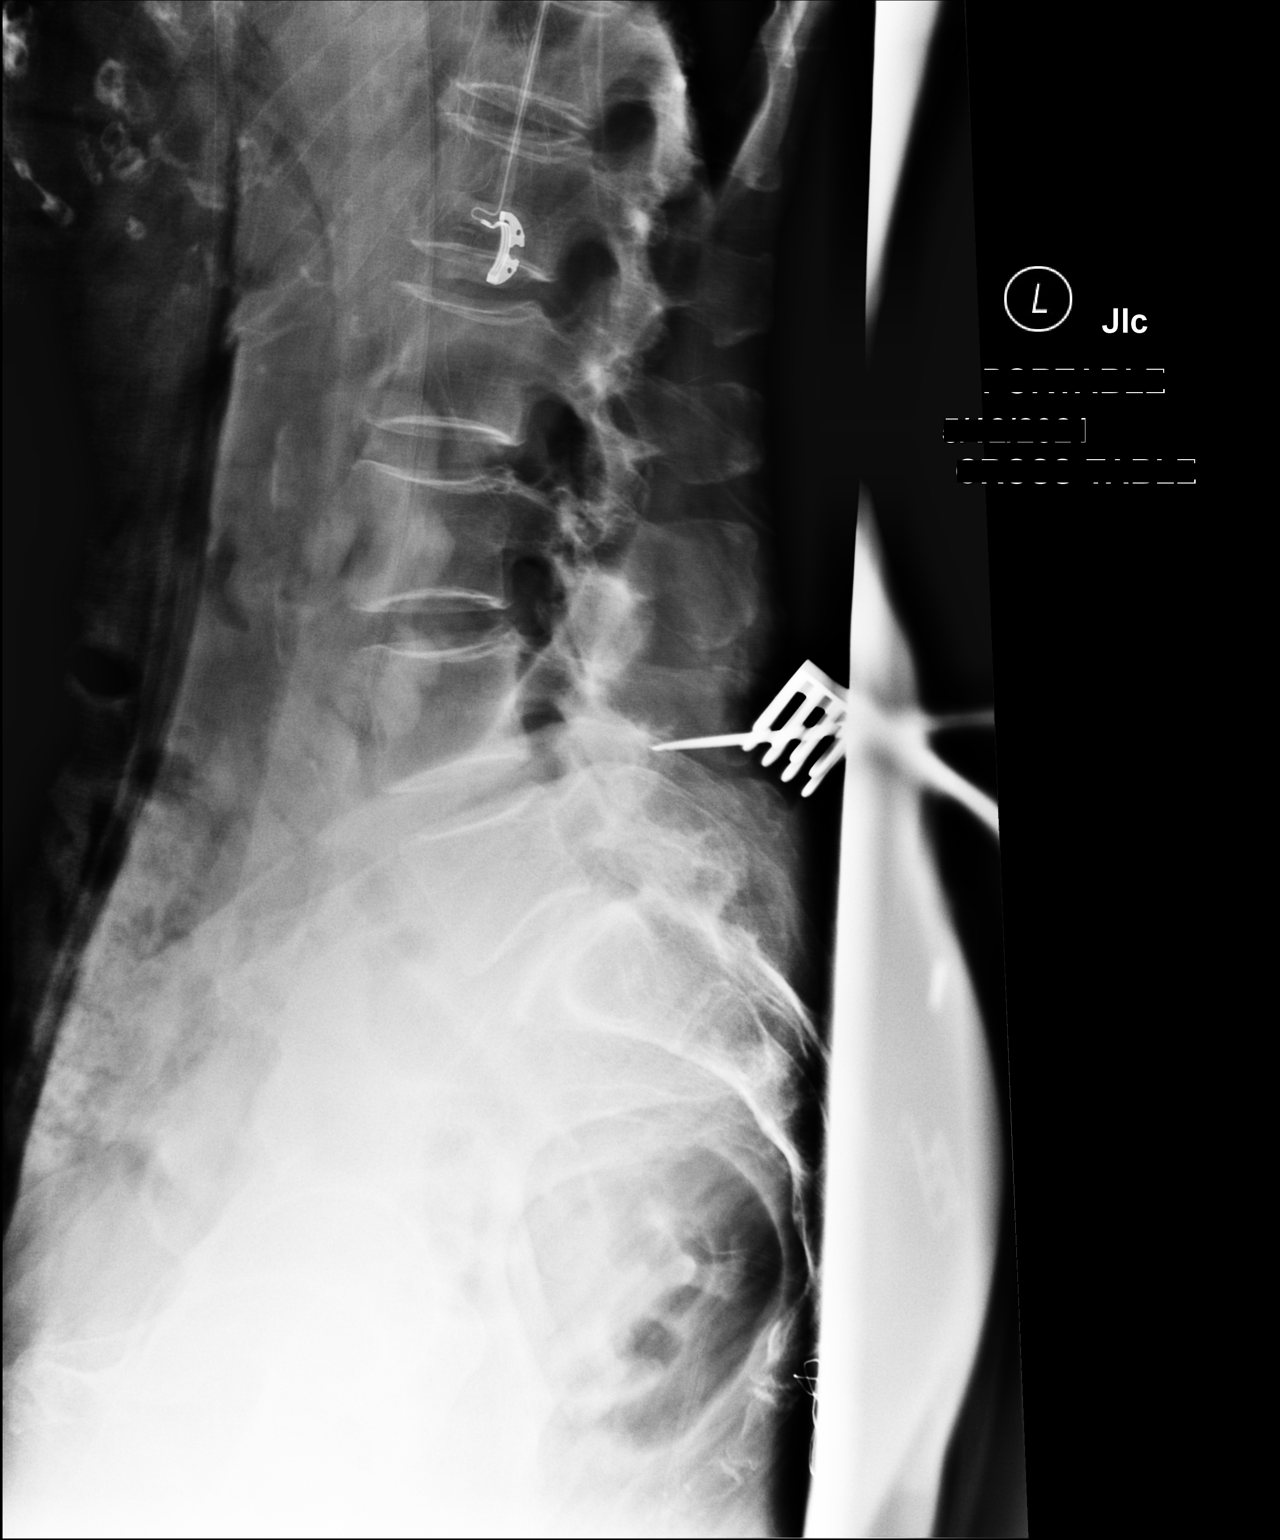

[1 of 1 positions shown; findings below may reference images not displayed]

FINDINGS: Single intraoperative cross-table lateral projection of the lumbar
spine was obtained. This demonstrates surgical probe at the L4-5
level.
IMPRESSION: Surgical localization as described above.

## 2021-03-29 ENCOUNTER — Other Ambulatory Visit: Payer: Self-pay

## 2021-03-29 MED ORDER — LISINOPRIL-HYDROCHLOROTHIAZIDE 10-12.5 MG PO TABS: 1.0000 | ORAL_TABLET | Freq: Every day | ORAL | 1 refills | Status: DC

## 2021-03-29 MED ORDER — ROSUVASTATIN CALCIUM 5 MG PO TABS: 5.0000 mg | ORAL_TABLET | Freq: Every day | ORAL | 1 refills | Status: DC

## 2021-03-31 ENCOUNTER — Other Ambulatory Visit: Payer: Self-pay

## 2021-03-31 MED ORDER — LISINOPRIL-HYDROCHLOROTHIAZIDE 10-12.5 MG PO TABS: 1.0000 | ORAL_TABLET | Freq: Every day | ORAL | 1 refills | Status: DC

## 2021-03-31 MED ORDER — ROSUVASTATIN CALCIUM 5 MG PO TABS: 5.0000 mg | ORAL_TABLET | Freq: Every day | ORAL | 1 refills | Status: DC

## 2021-04-28 DIAGNOSIS — H353132 Nonexudative age-related macular degeneration, bilateral, intermediate dry stage: Secondary | ICD-10-CM | POA: Diagnosis not present

## 2021-04-28 DIAGNOSIS — Z0100 Encounter for examination of eyes and vision without abnormal findings: Secondary | ICD-10-CM | POA: Diagnosis not present

## 2021-07-20 DIAGNOSIS — H524 Presbyopia: Secondary | ICD-10-CM | POA: Diagnosis not present

## 2021-07-28 ENCOUNTER — Ambulatory Visit (INDEPENDENT_AMBULATORY_CARE_PROVIDER_SITE_OTHER): Payer: Medicare HMO | Admitting: Nurse Practitioner

## 2021-07-28 ENCOUNTER — Encounter: Payer: Self-pay | Admitting: Nurse Practitioner

## 2021-07-28 ENCOUNTER — Other Ambulatory Visit: Payer: Self-pay

## 2021-07-28 VITALS — BP 135/70 | HR 64 | Temp 98.7°F | Resp 16 | Ht 60.0 in | Wt 131.6 lb

## 2021-07-28 DIAGNOSIS — E039 Hypothyroidism, unspecified: Secondary | ICD-10-CM

## 2021-07-28 DIAGNOSIS — I10 Essential (primary) hypertension: Secondary | ICD-10-CM

## 2021-07-28 DIAGNOSIS — M810 Age-related osteoporosis without current pathological fracture: Secondary | ICD-10-CM

## 2021-07-28 DIAGNOSIS — Z0001 Encounter for general adult medical examination with abnormal findings: Secondary | ICD-10-CM

## 2021-07-28 DIAGNOSIS — R300 Dysuria: Secondary | ICD-10-CM | POA: Diagnosis not present

## 2021-07-28 DIAGNOSIS — E782 Mixed hyperlipidemia: Secondary | ICD-10-CM | POA: Diagnosis not present

## 2021-07-28 MED ORDER — LEVOTHYROXINE SODIUM 75 MCG PO TABS: 75.0000 ug | ORAL_TABLET | Freq: Every day | ORAL | 1 refills | Status: DC

## 2021-07-28 NOTE — Progress Notes (Signed)
[facility] [place], [place] [address]  Internal MEDICINE  Office Visit Note  Patient Name: Samantha Dennis  [id number]  [id number]  Date of Service: 07/28/2021  Chief Complaint  Patient presents with   Medicare Wellness   Hypertension    HPI Samantha Dennis presents for an annual well visit and physical exam. She is a well appearing 88 yo female with hypertension, hypothyroidism and osteoporosis. Her blood pressure is well controlled. She is due for routine labs. She is not due for any preventive screenings at this time. She does still want to have her bone density scan repeated and a mammogram.  The only pain she has is occasional right shoulder pain due to arthritis. She has no other concerns.      Current Medication: Outpatient Encounter Medications as of 07/28/2021  Medication Sig   Cholecalciferol (VITAMIN D) 125 MCG (5000 UT) CAPS Take 5,000 Units by mouth daily.   Collagen-Boron-Hyaluronic Acid (MOVE FREE ULTRA JOINT HEALTH PO) Take 1 tablet by mouth daily.   famciclovir (FAMVIR) 250 MG tablet TAKE 1 TABLET EVERY DAY   ibandronate (BONIVA) 150 MG tablet TAKE 1 TABLET EVERY MONTH ON AN EMPTY STOMACH. DO NOT LIE DOWN FOR AT LEAST 1 HOUR AFTER TAKING DOSE.   lisinopril-hydrochlorothiazide (ZESTORETIC) 10-12.5 MG tablet Take 1 tablet by mouth daily.   Lutein 20 MG TABS Take 20 mg by mouth daily.   rosuvastatin (CRESTOR) 5 MG tablet Take 1 tablet (5 mg total) by mouth daily.   [DISCONTINUED] levothyroxine (SYNTHROID) 75 MCG tablet TAKE 1 TABLET EVERY DAY BEFORE BREAKFAST   levothyroxine (SYNTHROID) 75 MCG tablet Take 1 tablet (75 mcg total) by mouth daily before breakfast.   No facility-administered encounter medications on file as of 07/28/2021.    Surgical History: Past Surgical History:  Procedure Laterality Date   LUMBAR LAMINECTOMY/DECOMPRESSION MICRODISCECTOMY Right 11/04/2019   Procedure: Laminectomy for facet/synovial cyst - Lumbar Four- Lumbar Five - right;   Surgeon: Eustace Moore, MD;  Location: [facility];  Service: Neurosurgery;  Laterality: Right;  Laminectomy for facet/synovial cyst - Lumbar Four- Lumbar Five - right   TOTAL ABDOMINAL HYSTERECTOMY Bilateral 1984    Medical History: Past Medical History:  Diagnosis Date   Hay fever    Hypertension    Hypothyroidism    Osteoporosis    UTI (urinary tract infection)    last one 06/2019    Family History: Family History  Problem Relation Age of Onset   Breast cancer Neg Hx     Social History   Socioeconomic History   Marital status: Married    Spouse name: Not on file   Number of children: Not on file   Years of education: Not on file   Highest education level: Not on file  Occupational History   Not on file  Tobacco Use   Smoking status: Never   Smokeless tobacco: Never  Vaping Use   Vaping Use: Never used  Substance and Sexual Activity   Alcohol use: Yes    Alcohol/week: 1.0 standard drink    Types: 1 Glasses of wine per week    Comment: ocassionally    Drug use: No   Sexual activity: Not on file  Other Topics Concern   Not on file  Social History Narrative   Not on file   Social Determinants of Health   Financial Resource Strain: Not on file  Food Insecurity: Not on file  Transportation Needs: Not on file  Physical Activity: Not on  file  Stress: Not on file  Social Connections: Not on file  Intimate Partner Violence: Not on file      Review of Systems  Constitutional:  Negative for activity change, appetite change, chills, fatigue, fever and unexpected weight change.  HENT: Negative.  Negative for congestion, ear pain, rhinorrhea, sore throat and trouble swallowing.   Eyes: Negative.   Respiratory: Negative.  Negative for cough, chest tightness, shortness of breath and wheezing.   Cardiovascular: Negative.  Negative for chest pain.  Gastrointestinal: Negative.  Negative for abdominal pain, blood in stool, constipation, diarrhea, nausea and vomiting.   Endocrine: Negative.   Genitourinary: Negative.  Negative for difficulty urinating, dysuria, frequency, hematuria and urgency.  Musculoskeletal: Negative.  Negative for arthralgias, back pain, joint swelling, myalgias and neck pain.  Skin: Negative.  Negative for rash and wound.  Allergic/Immunologic: Negative.  Negative for immunocompromised state.  Neurological: Negative.  Negative for dizziness, seizures, numbness and headaches.  Hematological: Negative.   Psychiatric/Behavioral: Negative.  Negative for behavioral problems, self-injury and suicidal ideas. The patient is not nervous/anxious.    Vital Signs: Pulse 64    Temp 98.7 F (37.1 C)    Resp 16    Ht 5' (1.524 m)    Wt 131 lb 9.6 oz (59.7 kg)    SpO2 98%    BMI 25.70 kg/m    Physical Exam Vitals reviewed.  Constitutional:      General: She is awake. She is not in acute distress.    Appearance: Normal appearance. She is well-developed, well-groomed and normal weight. She is not ill-appearing or diaphoretic.  HENT:     Head: Normocephalic and atraumatic.     Right Ear: Tympanic membrane, ear canal and external ear normal.     Left Ear: Tympanic membrane, ear canal and external ear normal.     Nose: Nose normal. No congestion or rhinorrhea.     Mouth/Throat:     Lips: Pink.     Mouth: Mucous membranes are moist.     Pharynx: Oropharynx is clear. Uvula midline. No oropharyngeal exudate or posterior oropharyngeal erythema.  Eyes:     General: Lids are normal. Vision grossly intact. Gaze aligned appropriately. No scleral icterus.       Right eye: No discharge.        Left eye: No discharge.     Extraocular Movements: Extraocular movements intact.     Conjunctiva/sclera: Conjunctivae normal.     Pupils: Pupils are equal, round, and reactive to light.     Funduscopic exam:    Right eye: Red reflex present.        Left eye: Red reflex present. Neck:     Thyroid: No thyromegaly.     Vascular: No JVD.     Trachea: Trachea  and phonation normal. No tracheal deviation.  Cardiovascular:     Rate and Rhythm: Normal rate and regular rhythm.     Pulses: Normal pulses.     Heart sounds: Normal heart sounds, S1 normal and S2 normal. No murmur heard.   No friction rub. No gallop.  Pulmonary:     Effort: Pulmonary effort is normal. No accessory muscle usage or respiratory distress.     Breath sounds: Normal breath sounds and air entry. No stridor. No wheezing or rales.  Chest:     Chest wall: No tenderness.     Comments: Declined clinical breast exam. Abdominal:     General: Bowel sounds are normal. There is no distension.  Palpations: Abdomen is soft. There is no shifting dullness, fluid wave, mass or pulsatile mass.     Tenderness: There is no abdominal tenderness. There is no guarding or rebound.  Musculoskeletal:        General: No tenderness or deformity. Normal range of motion.     Cervical back: Normal range of motion and neck supple.     Right lower leg: No edema.     Left lower leg: No edema.  Lymphadenopathy:     Cervical: No cervical adenopathy.  Skin:    General: Skin is warm and dry.     Capillary Refill: Capillary refill takes less than 2 seconds.     Coloration: Skin is not pale.     Findings: No erythema or rash.  Neurological:     Mental Status: She is alert and oriented to person, place, and time.     Cranial Nerves: No cranial nerve deficit.     Motor: No abnormal muscle tone.     Coordination: Coordination normal.     Deep Tendon Reflexes: Reflexes are normal and symmetric.  Psychiatric:        Mood and Affect: Mood normal.        Behavior: Behavior normal. Behavior is cooperative.        Thought Content: Thought content normal.        Judgment: Judgment normal.       Assessment/Plan: 1. Encounter for routine adult health examination with abnormal findings Age-appropriate preventive screenings and vaccinations discussed, annual physical exam completed. Routine labs for health  maintenance ordered, see below. PHM updated.  - CBC with Differential/Platelet - CMP14+EGFR - Lipid Profile - TSH + free T4  2. Essential hypertension Routine lab ordered. BP is stable with current medication.  - CBC with Differential/Platelet  3. Acquired hypothyroidism Levothyroxine refills ordered, routine labs ordered.  - levothyroxine (SYNTHROID) 75 MCG tablet; Take 1 tablet (75 mcg total) by mouth daily before breakfast.  Dispense: 90 tablet; Refill: 1 - CBC with Differential/Platelet - TSH + free T4  4. Mixed hyperlipidemia Routine labs ordered.  - CBC with Differential/Platelet - Lipid Profile  5. Age-related osteoporosis without current pathological fracture Routine labs ordered, patient reminded to get bone density scan done. - CBC with Differential/Platelet - CMP14+EGFR  6. Dysuria Routine urinalysis done - UA/M w/rflx Culture, Routine - Microscopic Examination     General Counseling: Zeyna verbalizes understanding of the findings of todays visit and agrees with plan of treatment. I have discussed any further diagnostic evaluation that may be needed or ordered today. We also reviewed her medications today. she has been encouraged to call the office with any questions or concerns that should arise related to todays visit.    Orders Placed This Encounter  Procedures   CBC with Differential/Platelet   CMP14+EGFR   Lipid Profile   TSH + free T4    Meds ordered this encounter  Medications   levothyroxine (SYNTHROID) 75 MCG tablet    Sig: Take 1 tablet (75 mcg total) by mouth daily before breakfast.    Dispense:  90 tablet    Refill:  1    Return in about 6 months (around 01/25/2022) for F/U, med refill, Elyon Zoll PCP.   Total time spent:30 Minutes Time spent includes review of chart, medications, test results, and follow up plan with the patient.   [place] Controlled Substance Database was reviewed by me.  This patient was seen by Jonetta Osgood, FNP-C in  collaboration with Dr. Latricia Heft  Humphrey Rolls as a part of collaborative care agreement.  Severino Paolo R. Valetta Fuller, MSN, FNP-C Internal medicine

## 2021-07-29 LAB — UA/M W/RFLX CULTURE, ROUTINE
Bilirubin, UA: NEGATIVE
Glucose, UA: NEGATIVE
Ketones, UA: NEGATIVE
Leukocytes,UA: NEGATIVE
Nitrite, UA: NEGATIVE
Protein,UA: NEGATIVE
RBC, UA: NEGATIVE
Specific Gravity, UA: 1.013 (ref 1.005–1.030)
Urobilinogen, Ur: 0.2 mg/dL (ref 0.2–1.0)
pH, UA: 8 — ABNORMAL HIGH (ref 5.0–7.5)

## 2021-07-29 LAB — MICROSCOPIC EXAMINATION
Bacteria, UA: NONE SEEN
Casts: NONE SEEN /lpf
RBC, Urine: NONE SEEN /hpf (ref 0–2)

## 2021-07-30 ENCOUNTER — Encounter: Payer: Self-pay | Admitting: Nurse Practitioner

## 2021-11-02 ENCOUNTER — Other Ambulatory Visit: Payer: Self-pay | Admitting: Nurse Practitioner

## 2021-11-02 DIAGNOSIS — Z1231 Encounter for screening mammogram for malignant neoplasm of breast: Secondary | ICD-10-CM

## 2021-11-06 ENCOUNTER — Other Ambulatory Visit: Payer: Self-pay | Admitting: Internal Medicine

## 2021-11-23 DIAGNOSIS — Z0100 Encounter for examination of eyes and vision without abnormal findings: Secondary | ICD-10-CM | POA: Diagnosis not present

## 2021-11-23 DIAGNOSIS — H353132 Nonexudative age-related macular degeneration, bilateral, intermediate dry stage: Secondary | ICD-10-CM | POA: Diagnosis not present

## 2021-12-02 ENCOUNTER — Other Ambulatory Visit: Payer: Self-pay | Admitting: Internal Medicine

## 2021-12-25 ENCOUNTER — Ambulatory Visit
Admission: RE | Admit: 2021-12-25 | Discharge: 2021-12-25 | Disposition: A | Discharge status: Home / Self Care | Payer: Medicare HMO | Source: Ambulatory Visit | Attending: Nurse Practitioner | Admitting: Nurse Practitioner

## 2021-12-25 DIAGNOSIS — M8589 Other specified disorders of bone density and structure, multiple sites: Secondary | ICD-10-CM | POA: Diagnosis not present

## 2021-12-25 DIAGNOSIS — Z1231 Encounter for screening mammogram for malignant neoplasm of breast: Secondary | ICD-10-CM | POA: Diagnosis not present

## 2021-12-25 DIAGNOSIS — M810 Age-related osteoporosis without current pathological fracture: Secondary | ICD-10-CM | POA: Diagnosis not present

## 2021-12-29 ENCOUNTER — Telehealth: Payer: Self-pay

## 2021-12-29 NOTE — Telephone Encounter (Signed)
-----   Message from [name], MD sent at 12/25/2021  8:09 PM EDT ----- Pt has osteoporosis, will discuss on next visit

## 2021-12-29 NOTE — Telephone Encounter (Signed)
Spoke to pt and informed her that bone density showed osteoporosis and will be discussed at next OV

## 2022-01-16 ENCOUNTER — Other Ambulatory Visit: Payer: Self-pay | Admitting: Nurse Practitioner

## 2022-01-16 DIAGNOSIS — E039 Hypothyroidism, unspecified: Secondary | ICD-10-CM

## 2022-01-22 ENCOUNTER — Other Ambulatory Visit: Payer: Self-pay | Admitting: Internal Medicine

## 2022-01-23 DIAGNOSIS — Z0001 Encounter for general adult medical examination with abnormal findings: Secondary | ICD-10-CM | POA: Diagnosis not present

## 2022-01-23 DIAGNOSIS — I10 Essential (primary) hypertension: Secondary | ICD-10-CM | POA: Diagnosis not present

## 2022-01-23 DIAGNOSIS — E039 Hypothyroidism, unspecified: Secondary | ICD-10-CM | POA: Diagnosis not present

## 2022-01-23 DIAGNOSIS — E782 Mixed hyperlipidemia: Secondary | ICD-10-CM | POA: Diagnosis not present

## 2022-01-23 DIAGNOSIS — M810 Age-related osteoporosis without current pathological fracture: Secondary | ICD-10-CM | POA: Diagnosis not present

## 2022-01-24 LAB — CMP14+EGFR
ALT: 17 IU/L (ref 0–32)
AST: 25 IU/L (ref 0–40)
Albumin/Globulin Ratio: 1.9 (ref 1.2–2.2)
Albumin: 4.3 g/dL (ref 3.7–4.7)
Alkaline Phosphatase: 49 IU/L (ref 44–121)
BUN/Creatinine Ratio: 24 (ref 12–28)
BUN: 17 mg/dL (ref 8–27)
Bilirubin Total: 0.7 mg/dL (ref 0.0–1.2)
CO2: 27 mmol/L (ref 20–29)
Calcium: 9.6 mg/dL (ref 8.7–10.3)
Chloride: 98 mmol/L (ref 96–106)
Creatinine, Ser: 0.7 mg/dL (ref 0.57–1.00)
Globulin, Total: 2.3 g/dL (ref 1.5–4.5)
Glucose: 92 mg/dL (ref 70–99)
Potassium: 4.5 mmol/L (ref 3.5–5.2)
Sodium: 139 mmol/L (ref 134–144)
Total Protein: 6.6 g/dL (ref 6.0–8.5)
eGFR: 84 mL/min/{1.73_m2} (ref >59)

## 2022-01-24 LAB — LIPID PANEL
Chol/HDL Ratio: 2.4 ratio (ref 0.0–4.4)
Cholesterol, Total: 170 mg/dL (ref 100–199)
HDL: 71 mg/dL (ref >39)
LDL Chol Calc (NIH): 85 mg/dL (ref 0–99)
Triglycerides: 74 mg/dL (ref 0–149)
VLDL Cholesterol Cal: 14 mg/dL (ref 5–40)

## 2022-01-24 LAB — CBC WITH DIFFERENTIAL/PLATELET
Basophils Absolute: 0.1 10*3/uL (ref 0.0–0.2)
Basos: 1 %
EOS (ABSOLUTE): 0.1 10*3/uL (ref 0.0–0.4)
Eos: 2 %
Hematocrit: 43.7 % (ref 34.0–46.6)
Hemoglobin: 15.3 g/dL (ref 11.1–15.9)
Immature Grans (Abs): 0 10*3/uL (ref 0.0–0.1)
Immature Granulocytes: 0 %
Lymphocytes Absolute: 2.5 10*3/uL (ref 0.7–3.1)
Lymphs: 41 %
MCH: 31.8 pg (ref 26.6–33.0)
MCHC: 35 g/dL (ref 31.5–35.7)
MCV: 91 fL (ref 79–97)
Monocytes Absolute: 0.7 10*3/uL (ref 0.1–0.9)
Monocytes: 12 %
Neutrophils Absolute: 2.7 10*3/uL (ref 1.4–7.0)
Neutrophils: 44 %
Platelets: 199 10*3/uL (ref 150–450)
RBC: 4.81 x10E6/uL (ref 3.77–5.28)
RDW: 13 % (ref 11.7–15.4)
WBC: 6.1 10*3/uL (ref 3.4–10.8)

## 2022-01-24 LAB — TSH+FREE T4
Free T4: 1.59 ng/dL (ref 0.82–1.77)
TSH: 2.5 u[IU]/mL (ref 0.450–4.500)

## 2022-01-26 ENCOUNTER — Ambulatory Visit: Payer: Medicare HMO | Admitting: Nurse Practitioner

## 2022-01-29 ENCOUNTER — Ambulatory Visit (INDEPENDENT_AMBULATORY_CARE_PROVIDER_SITE_OTHER): Payer: Medicare HMO | Admitting: Nurse Practitioner

## 2022-01-29 ENCOUNTER — Encounter: Payer: Self-pay | Admitting: Nurse Practitioner

## 2022-01-29 VITALS — BP 130/75 | HR 62 | Temp 98.3°F | Resp 16 | Ht 60.0 in | Wt 129.4 lb

## 2022-01-29 DIAGNOSIS — M810 Age-related osteoporosis without current pathological fracture: Secondary | ICD-10-CM

## 2022-01-29 DIAGNOSIS — E782 Mixed hyperlipidemia: Secondary | ICD-10-CM

## 2022-01-29 DIAGNOSIS — I10 Essential (primary) hypertension: Secondary | ICD-10-CM | POA: Diagnosis not present

## 2022-01-29 DIAGNOSIS — E039 Hypothyroidism, unspecified: Secondary | ICD-10-CM | POA: Diagnosis not present

## 2022-01-29 MED ORDER — ROSUVASTATIN CALCIUM 5 MG PO TABS: 5.0000 mg | ORAL_TABLET | Freq: Every day | ORAL | 1 refills | Status: DC

## 2022-01-29 MED ORDER — LISINOPRIL-HYDROCHLOROTHIAZIDE 10-12.5 MG PO TABS: 1.0000 | ORAL_TABLET | Freq: Every day | ORAL | 1 refills | Status: DC

## 2022-01-29 NOTE — Progress Notes (Signed)
[facility] [place], [place] [address]  Internal MEDICINE  Office Visit Note  Patient Name: Samantha Dennis  [id number]  [id number]  Date of Service: 01/29/2022  Chief Complaint  Patient presents with   Follow-up   Hypertension   Quality Metric Gaps    Tetanus Vaccine    HPI Mahiya presents for a follow up visit for hypertension lab results, dexa results and refills. Hypertension -- controlled with current medication Lab results -- cholesterol panel, CBC, thyroid levels are all normal Dexa scan -- osteopenia of the left femur neck and A/P spine and osteoporosis of the left forearm.  Mammogram -- Result was BI-RADS category 1 negative in July this year.       Current Medication: Outpatient Encounter Medications as of 01/29/2022  Medication Sig   Cholecalciferol (VITAMIN D) 125 MCG (5000 UT) CAPS Take 5,000 Units by mouth daily.   famciclovir (FAMVIR) 250 MG tablet TAKE 1 TABLET EVERY DAY   ibandronate (BONIVA) 150 MG tablet TAKE 1 TABLET EVERY MONTH ON AN EMPTY STOMACH. DO NOT LIE DOWN FOR AT LEAST 1 HOUR AFTER TAKING DOSE   levothyroxine (SYNTHROID) 75 MCG tablet TAKE 1 TABLET EVERY DAY BEFORE BREAKFAST   Multiple Vitamins-Minerals (PRESERVISION AREDS 2) CAPS Take 2 capsules by mouth daily.   [DISCONTINUED] Collagen-Boron-Hyaluronic Acid (MOVE FREE ULTRA JOINT HEALTH PO) Take 1 tablet by mouth daily.   [DISCONTINUED] lisinopril-hydrochlorothiazide (ZESTORETIC) 10-12.5 MG tablet TAKE 1 TABLET EVERY DAY   [DISCONTINUED] Lutein 20 MG TABS Take 20 mg by mouth daily.   [DISCONTINUED] rosuvastatin (CRESTOR) 5 MG tablet TAKE 1 TABLET EVERY DAY   lisinopril-hydrochlorothiazide (ZESTORETIC) 10-12.5 MG tablet Take 1 tablet by mouth daily.   rosuvastatin (CRESTOR) 5 MG tablet Take 1 tablet (5 mg total) by mouth daily.   No facility-administered encounter medications on file as of 01/29/2022.    Surgical History: Past Surgical History:  Procedure Laterality Date    LUMBAR LAMINECTOMY/DECOMPRESSION MICRODISCECTOMY Right 11/04/2019   Procedure: Laminectomy for facet/synovial cyst - Lumbar Four- Lumbar Five - right;  Surgeon: Eustace Moore, MD;  Location: [facility];  Service: Neurosurgery;  Laterality: Right;  Laminectomy for facet/synovial cyst - Lumbar Four- Lumbar Five - right   TOTAL ABDOMINAL HYSTERECTOMY Bilateral 1984    Medical History: Past Medical History:  Diagnosis Date   Hay fever    Hypertension    Hypothyroidism    Osteoporosis    UTI (urinary tract infection)    last one 06/2019    Family History: Family History  Problem Relation Age of Onset   Breast cancer Neg Hx     Social History   Socioeconomic History   Marital status: Married    Spouse name: Not on file   Number of children: Not on file   Years of education: Not on file   Highest education level: Not on file  Occupational History   Not on file  Tobacco Use   Smoking status: Never   Smokeless tobacco: Never  Vaping Use   Vaping Use: Never used  Substance and Sexual Activity   Alcohol use: Yes    Alcohol/week: 1.0 standard drink of alcohol    Types: 1 Glasses of wine per week    Comment: ocassionally    Drug use: No   Sexual activity: Not on file  Other Topics Concern   Not on file  Social History Narrative   Not on file   Social Determinants of Health   Financial Resource Strain: Not  on file  Food Insecurity: Not on file  Transportation Needs: Not on file  Physical Activity: Not on file  Stress: Not on file  Social Connections: Not on file  Intimate Partner Violence: Not on file      Review of Systems  Constitutional:  Negative for chills, fatigue and unexpected weight change.  HENT:  Negative for congestion, rhinorrhea, sneezing and sore throat.   Eyes:  Negative for redness.  Respiratory:  Negative for cough, chest tightness and shortness of breath.   Cardiovascular:  Negative for chest pain and palpitations.  Gastrointestinal:  Negative for  abdominal pain, constipation, diarrhea, nausea and vomiting.  Genitourinary:  Negative for dysuria and frequency.  Musculoskeletal:  Negative for arthralgias, back pain, joint swelling and neck pain.  Skin:  Negative for rash.  Neurological: Negative.  Negative for tremors and numbness.  Hematological:  Negative for adenopathy. Does not bruise/bleed easily.  Psychiatric/Behavioral:  Negative for behavioral problems (Depression), sleep disturbance and suicidal ideas. The patient is not nervous/anxious.     Vital Signs: BP 130/75 (BP Location: Left Arm, Patient Position: Sitting, Cuff Size: Normal)   Pulse 62   Temp 98.3 F (36.8 C)   Resp 16   Ht 5' (1.524 m)   Wt 129 lb 6.4 oz (58.7 kg)   SpO2 99%   BMI 25.27 kg/m    Physical Exam Vitals reviewed.  Constitutional:      General: She is not in acute distress.    Appearance: Normal appearance. She is not ill-appearing.  HENT:     Head: Normocephalic and atraumatic.  Cardiovascular:     Rate and Rhythm: Normal rate and regular rhythm.  Pulmonary:     Effort: Pulmonary effort is normal. No respiratory distress.  Skin:    General: Skin is warm and dry.     Capillary Refill: Capillary refill takes less than 2 seconds.  Neurological:     Mental Status: She is alert and oriented to person, place, and time.  Psychiatric:        Mood and Affect: Mood normal.        Behavior: Behavior normal.        Assessment/Plan: 1. Essential hypertension Stable with current medications, no changes, refills ordered - lisinopril-hydrochlorothiazide (ZESTORETIC) 10-12.5 MG tablet; Take 1 tablet by mouth daily.  Dispense: 90 tablet; Refill: 1  2. Acquired hypothyroidism Stable with current levothyroxine dose  3. Mixed hyperlipidemia Cholesterol levels are normal with current statin therapy, no changes, refills ordered - rosuvastatin (CRESTOR) 5 MG tablet; Take 1 tablet (5 mg total) by mouth daily.  Dispense: 90 tablet; Refill: 1  4.  Age-related osteoporosis without current pathological fracture Discussed dexa scan results, currently taking ibandronate for treatment, continue as prescribed.    General Counseling: chonita gadea understanding of the findings of todays visit and agrees with plan of treatment. I have discussed any further diagnostic evaluation that may be needed or ordered today. We also reviewed her medications today. she has been encouraged to call the office with any questions or concerns that should arise related to todays visit.    No orders of the defined types were placed in this encounter.   Meds ordered this encounter  Medications   lisinopril-hydrochlorothiazide (ZESTORETIC) 10-12.5 MG tablet    Sig: Take 1 tablet by mouth daily.    Dispense:  90 tablet    Refill:  1    For future refills   rosuvastatin (CRESTOR) 5 MG tablet    Sig: Take  1 tablet (5 mg total) by mouth daily.    Dispense:  90 tablet    Refill:  1    For future refills    Return for previously scheduled, CPE, Godson Pollan PCP in february..   Total time spent:30 Minutes Time spent includes review of chart, medications, test results, and follow up plan with the patient.   [place] Controlled Substance Database was reviewed by me.  This patient was seen by Jonetta Osgood, FNP-C in collaboration with Dr. Clayborn Bigness as a part of collaborative care agreement.   Jihaad Bruschi R. Valetta Fuller, MSN, FNP-C Internal medicine

## 2022-02-06 DIAGNOSIS — M25511 Pain in right shoulder: Secondary | ICD-10-CM | POA: Diagnosis not present

## 2022-02-06 DIAGNOSIS — M19011 Primary osteoarthritis, right shoulder: Secondary | ICD-10-CM | POA: Diagnosis not present

## 2022-03-12 ENCOUNTER — Encounter: Payer: Self-pay | Admitting: Nurse Practitioner

## 2022-06-14 DIAGNOSIS — H353132 Nonexudative age-related macular degeneration, bilateral, intermediate dry stage: Secondary | ICD-10-CM | POA: Diagnosis not present

## 2022-06-28 ENCOUNTER — Encounter: Payer: Self-pay | Admitting: Ophthalmology

## 2022-07-05 DIAGNOSIS — H2511 Age-related nuclear cataract, right eye: Secondary | ICD-10-CM | POA: Diagnosis not present

## 2022-07-06 NOTE — Discharge Instructions (Signed)
   Cataract Surgery, Care After ? ?This sheet gives you information about how to care for yourself after your surgery.  Your ophthalmologist may also give you more specific instructions.  If you have problems or questions, contact your doctor at [facility], [phone]. ? ?What can I expect after the surgery? ?It is common to have: ?Itching ?Foreign body sensation (feels like a grain of sand in the eye) ?Watery discharge (excess tearing) ?Sensitivity to light and touch ?Bruising in or around the eye ?Mild blurred vision ? ?Follow these instructions at home: ?Do not touch or rub your eyes. ?You may be told to wear a protective shield or sunglasses to protect your eyes. ?Do not put a contact lens in the operative eye unless your doctor approves. ?Keep the lids and face clean and dry. ?Do not allow water to hit you directly in the face while showering. ?Keep soap and shampoo out of your eyes. ?Do not use eye makeup for 1 week. ? ?Check your eye every day for signs of infection.  Watch for: ?Redness, swelling, or pain. ?Fluid, blood or pus. ?Worsening vision. ?Worsening sensitivity to light or touch. ? ?Activity: ?During the first day, avoid bending over and reading.  You may resume reading and bending the next day. ?Do not drive or use heavy machinery for at least 24 hours. ?Avoid strenuous activities for 1 week.  Activities such as walking, treadmill, exercise bike, and climbing stairs are okay. ?Do not lift heavy (>20 pound) objects for 1 week. ?Do not do yardwork, gardening, or dirty housework (mopping, cleaning bathrooms, vacuuming, etc.) for 1 week. ?Do not swim or use a hot tub for 2 weeks. ?Ask your doctor when you can return to work. ? ?General Instructions: ?Take or apply prescription and over-the-counter medicines as directed by your doctor, including eyedrops and ointments. ?Resume medications discontinued prior to surgery, unless told otherwise by your doctor. ?Keep all follow up appointments as  scheduled. ? ?Contact a health care provider if: ?You have increased bruising around your eye. ?You have pain that is not helped with medication. ?You have a fever. ?You have fluid, pus, or blood coming from your eye or incision. ?Your sensitivity to light gets worse. ?You have spots (floaters) of flashing lights in your vision. ?You have nausea or vomiting. ? ?Go to the nearest emergency room or call 911 if: ?You have sudden loss of vision. ?You have severe, worsening eye pain. ? ?

## 2022-07-06 NOTE — Anesthesia Preprocedure Evaluation (Signed)
Anesthesia Evaluation  Patient identified by MRN, date of birth, ID band Patient awake    Reviewed: Allergy & Precautions, H&P , NPO status , Patient's Chart, lab work & pertinent test results  Airway Mallampati: II  TM Distance: >3 FB Neck ROM: full    Dental no notable dental hx.    Pulmonary neg pulmonary ROS   Pulmonary exam normal        Cardiovascular hypertension, Normal cardiovascular exam     Neuro/Psych negative neurological ROS  negative psych ROS   GI/Hepatic negative GI ROS, Neg liver ROS,,,  Endo/Other  Hypothyroidism    Renal/GU negative Renal ROS  negative genitourinary   Musculoskeletal  (+) Arthritis ,    Abdominal   Peds  Hematology negative hematology ROS (+)   Anesthesia Other Findings Past Medical History: No date: Arthritis     Comment:  Right shoulder No date: Hay fever No date: Hypertension No date: Hypothyroidism No date: Osteoporosis No date: UTI (urinary tract infection)     Comment:  last one 06/2019 No date: Wears hearing aid in both ears  Past Surgical History: 11/04/2019: LUMBAR LAMINECTOMY/DECOMPRESSION MICRODISCECTOMY; Right     Comment:  Procedure: Laminectomy for facet/synovial cyst - Lumbar               Four- Lumbar Five - right;  Surgeon: Eustace Moore, MD;               Location: [facility];  Service: Neurosurgery;  Laterality:               Right;  Laminectomy for facet/synovial cyst - Lumbar               Four- Lumbar Five - right 1984: TOTAL ABDOMINAL HYSTERECTOMY; Bilateral  BMI    Body Mass Index: 25.19 kg/m      Reproductive/Obstetrics negative OB ROS                             Anesthesia Physical Anesthesia Plan  ASA:   Anesthesia Plan: MAC   Post-op Pain Management:    Induction:   PONV Risk Score and Plan:   Airway Management Planned: Natural Airway  Additional Equipment:   Intra-op Plan:   Post-operative Plan:    Informed Consent:      Dental Advisory Given  Plan Discussed with: CRNA and Surgeon  Anesthesia Plan Comments:        Anesthesia Quick Evaluation

## 2022-07-09 ENCOUNTER — Ambulatory Visit: Payer: Medicare HMO | Admitting: Anesthesiology

## 2022-07-09 ENCOUNTER — Ambulatory Visit
Admission: RE | Admit: 2022-07-09 | Discharge: 2022-07-09 | Disposition: A | Discharge status: Home / Self Care | Payer: Medicare HMO | Attending: Ophthalmology | Admitting: Ophthalmology

## 2022-07-09 ENCOUNTER — Encounter
Admission: RE | Disposition: A | Discharge status: Home / Self Care | Payer: Self-pay | Source: Home / Self Care | Attending: Ophthalmology

## 2022-07-09 ENCOUNTER — Encounter: Payer: Self-pay | Admitting: Ophthalmology

## 2022-07-09 ENCOUNTER — Other Ambulatory Visit: Payer: Self-pay

## 2022-07-09 DIAGNOSIS — I10 Essential (primary) hypertension: Secondary | ICD-10-CM | POA: Insufficient documentation

## 2022-07-09 DIAGNOSIS — H2511 Age-related nuclear cataract, right eye: Secondary | ICD-10-CM | POA: Insufficient documentation

## 2022-07-09 DIAGNOSIS — E039 Hypothyroidism, unspecified: Secondary | ICD-10-CM | POA: Diagnosis not present

## 2022-07-09 HISTORY — PX: CATARACT EXTRACTION W/PHACO: SHX586

## 2022-07-09 HISTORY — DX: Presence of external hearing-aid: Z97.4

## 2022-07-09 HISTORY — DX: Unspecified osteoarthritis, unspecified site: M19.90

## 2022-07-09 SURGERY — PHACOEMULSIFICATION, CATARACT, WITH IOL INSERTION
Anesthesia: Monitor Anesthesia Care | Site: Eye | Laterality: Right

## 2022-07-09 MED ORDER — LIDOCAINE HCL (PF) 2 % IJ SOLN
INTRAOCULAR | Status: DC | PRN
  Administered 2022-07-09: 1 mL via INTRAOCULAR

## 2022-07-09 MED ORDER — SIGHTPATH DOSE#1 NA HYALUR & NA CHOND-NA HYALUR IO KIT
PACK | INTRAOCULAR | Status: DC | PRN
  Administered 2022-07-09: 1 via OPHTHALMIC

## 2022-07-09 MED ORDER — SIGHTPATH DOSE#1 BSS IO SOLN
INTRAOCULAR | Status: DC | PRN
  Administered 2022-07-09: 110 mL via OPHTHALMIC

## 2022-07-09 MED ORDER — FENTANYL CITRATE (PF) 100 MCG/2ML IJ SOLN
INTRAMUSCULAR | Status: DC | PRN
  Administered 2022-07-09: 50 ug via INTRAVENOUS

## 2022-07-09 MED ORDER — TETRACAINE HCL 0.5 % OP SOLN
1.0000 [drp] | OPHTHALMIC | Status: DC | PRN
  Administered 2022-07-09 (×3): 1 [drp] via OPHTHALMIC

## 2022-07-09 MED ORDER — ARMC OPHTHALMIC DILATING DROPS
1.0000 | OPHTHALMIC | Status: DC | PRN
  Administered 2022-07-09 (×2): 1 via OPHTHALMIC

## 2022-07-09 MED ORDER — SIGHTPATH DOSE#1 BSS IO SOLN
INTRAOCULAR | Status: DC | PRN
  Administered 2022-07-09: 15 mL

## 2022-07-09 MED ORDER — MOXIFLOXACIN HCL 0.5 % OP SOLN
OPHTHALMIC | Status: DC | PRN
  Administered 2022-07-09: .2 mL via OPHTHALMIC

## 2022-07-09 SURGICAL SUPPLY — 19 items
CANNULA ANT/CHMB 27G (MISCELLANEOUS) IMPLANT
CANNULA ANT/CHMB 27GA (MISCELLANEOUS) IMPLANT
CATARACT SUITE SIGHTPATH (MISCELLANEOUS) ×1 IMPLANT
DISSECTOR HYDRO NUCLEUS 50X22 (MISCELLANEOUS) ×1 IMPLANT
FEE CATARACT SUITE SIGHTPATH (MISCELLANEOUS) ×1 IMPLANT
GLOVE SURG GAMMEX PI TX LF 7.5 (GLOVE) ×1 IMPLANT
GLOVE SURG SYN 8.5  E (GLOVE) ×1
GLOVE SURG SYN 8.5 E (GLOVE) ×1 IMPLANT
GLOVE SURG SYN 8.5 PF PI (GLOVE) ×1 IMPLANT
LENS IOL TECNIS EYHANCE 21.5 (Intraocular Lens) IMPLANT
NDL FILTER BLUNT 18X1 1/2 (NEEDLE) ×1 IMPLANT
NEEDLE FILTER BLUNT 18X1 1/2 (NEEDLE) ×1 IMPLANT
PACK VIT ANT 23G (MISCELLANEOUS) IMPLANT
RING MALYGIN (MISCELLANEOUS) IMPLANT
SUT ETHILON 10-0 CS-B-6CS-B-6 (SUTURE)
SUTURE EHLN 10-0 CS-B-6CS-B-6 (SUTURE) IMPLANT
SYR 3ML LL SCALE MARK (SYRINGE) ×1 IMPLANT
SYR 5ML LL (SYRINGE) ×1 IMPLANT
WATER STERILE IRR 250ML POUR (IV SOLUTION) ×1 IMPLANT

## 2022-07-09 NOTE — H&P (Signed)
[facility]   Primary Care Physician:  [name], MD Ophthalmologist: Dr. Benay Pillow  Pre-Procedure History & Physical: HPI:  Samantha Dennis is a 89 y.o. female here for cataract surgery.   Past Medical History:  Diagnosis Date   Arthritis    Right shoulder   Hay fever    Hypertension    Hypothyroidism    Osteoporosis    UTI (urinary tract infection)    last one 06/2019   Wears hearing aid in both ears     Past Surgical History:  Procedure Laterality Date   LUMBAR LAMINECTOMY/DECOMPRESSION MICRODISCECTOMY Right 11/04/2019   Procedure: Laminectomy for facet/synovial cyst - Lumbar Four- Lumbar Five - right;  Surgeon: Eustace Moore, MD;  Location: [facility];  Service: Neurosurgery;  Laterality: Right;  Laminectomy for facet/synovial cyst - Lumbar Four- Lumbar Five - right   TOTAL ABDOMINAL HYSTERECTOMY Bilateral 1984    Prior to Admission medications   Medication Sig Start Date End Date Taking? Authorizing Provider  BIOTIN PO Take by mouth daily.   Yes [provider]  Cholecalciferol (VITAMIN D) 125 MCG (5000 UT) CAPS Take 5,000 Units by mouth daily.   Yes [provider]  famciclovir (FAMVIR) 250 MG tablet TAKE 1 TABLET EVERY DAY 12/02/21  Yes [name], MD  ibandronate (BONIVA) 150 MG tablet TAKE 1 TABLET EVERY MONTH ON AN EMPTY STOMACH. DO NOT LIE DOWN FOR AT LEAST 1 HOUR AFTER TAKING DOSE 01/22/22  Yes Abernathy, Alyssa, NP  levothyroxine (SYNTHROID) 75 MCG tablet TAKE 1 TABLET EVERY DAY BEFORE BREAKFAST 01/17/22  Yes Abernathy, Alyssa, NP  lisinopril-hydrochlorothiazide (ZESTORETIC) 10-12.5 MG tablet Take 1 tablet by mouth daily. 01/29/22  Yes Abernathy, Yetta Flock, NP  Multiple Vitamins-Minerals (PRESERVISION AREDS 2) CAPS Take 2 capsules by mouth daily.   Yes [provider]  rosuvastatin (CRESTOR) 5 MG tablet Take 1 tablet (5 mg total) by mouth daily. 01/29/22  Yes Jonetta Osgood, NP    Allergies as of 06/20/2022   (No Known Allergies)     Family History  Problem Relation Age of Onset   Breast cancer Neg Hx     Social History   Socioeconomic History   Marital status: Married    Spouse name: Not on file   Number of children: Not on file   Years of education: Not on file   Highest education level: Not on file  Occupational History   Not on file  Tobacco Use   Smoking status: Never   Smokeless tobacco: Never  Vaping Use   Vaping Use: Never used  Substance and Sexual Activity   Alcohol use: Yes    Alcohol/week: 1.0 standard drink of alcohol    Types: 1 Glasses of wine per week    Comment: ocassionally    Drug use: No   Sexual activity: Not on file  Other Topics Concern   Not on file  Social History Narrative   Not on file   Social Determinants of Health   Financial Resource Strain: Not on file  Food Insecurity: Not on file  Transportation Needs: Not on file  Physical Activity: Not on file  Stress: Not on file  Social Connections: Not on file  Intimate Partner Violence: Not on file    Review of Systems: See HPI, otherwise negative ROS  Physical Exam: BP (!) 179/81   Pulse 66   Temp 97.7 F (36.5 C) (Tympanic)   Resp (!) 8   Ht 5' (1.524 m)   Wt  58.3 kg   SpO2 100%   BMI 25.10 kg/m  General:   Alert, cooperative in NAD Head:  Normocephalic and atraumatic. Respiratory:  Normal work of breathing. Cardiovascular:  RRR  Impression/Plan: Samantha Dennis is here for cataract surgery.  Risks, benefits, limitations, and alternatives regarding cataract surgery have been reviewed with the patient.  Questions have been answered.  All parties agreeable.   Benay Pillow, MD  07/09/2022, 10:14 AM

## 2022-07-09 NOTE — Transfer of Care (Signed)
Immediate Anesthesia Transfer of Care Note  Patient: Samantha Dennis  Procedure(s) Performed: CATARACT EXTRACTION PHACO AND INTRAOCULAR LENS PLACEMENT (IOC) RIGHT (Right: Eye)  Patient Location: PACU  Anesthesia Type: MAC  Level of Consciousness: awake, alert  and patient cooperative  Airway and Oxygen Therapy: Patient Spontanous Breathing and Patient connected to supplemental oxygen  Post-op Assessment: Post-op Vital signs reviewed, Patient's Cardiovascular Status Stable, Respiratory Function Stable, Patent Airway and No signs of Nausea or vomiting  Post-op Vital Signs: Reviewed and stable  Complications: No notable events documented.

## 2022-07-09 NOTE — Op Note (Signed)
OPERATIVE NOTE  Jacquie Lukes [id number] 07/09/2022   PREOPERATIVE DIAGNOSIS:  Nuclear sclerotic cataract right eye.  H25.11   POSTOPERATIVE DIAGNOSIS:    Nuclear sclerotic cataract right eye.     PROCEDURE:  Phacoemusification with posterior chamber intraocular lens placement of the right eye   LENS:   Implant Name Type Inv. Item Serial No. Manufacturer Lot No. LRB No. Used Action  LENS IOL TECNIS EYHANCE 21.5 - [id number] Intraocular Lens LENS IOL TECNIS EYHANCE 21.5 [id number] [employer]  Right 1 Implanted       Procedure(s) with comments: CATARACT EXTRACTION PHACO AND INTRAOCULAR LENS PLACEMENT (IOC) RIGHT (Right) - 3.77 0:35.1  DIB00 +21.5   ULTRASOUND TIME: 0 minutes 35 seconds.  CDE 3.77   SURGEON:  Benay Pillow, MD, MPH  ANESTHESIOLOGIST: Anesthesiologist: Iran Ouch, MD CRNA: Londell Moh, CRNA   ANESTHESIA:  Topical with tetracaine drops augmented with 1% preservative-free intracameral lidocaine.  ESTIMATED BLOOD LOSS: less than 1 mL.   COMPLICATIONS:  None.   DESCRIPTION OF PROCEDURE:  The patient was identified in the holding room and transported to the operating room and placed in the supine position under the operating microscope.  The right eye was identified as the operative eye and it was prepped and draped in the usual sterile ophthalmic fashion.   A 1.0 millimeter clear-corneal paracentesis was made at the 10:30 position. 0.5 ml of preservative-free 1% lidocaine with epinephrine was injected into the anterior chamber.  The anterior chamber was filled with viscoelastic.  A 2.4 millimeter keratome was used to make a near-clear corneal incision at the 8:00 position.  A curvilinear capsulorrhexis was made with a cystotome and capsulorrhexis forceps.  Balanced salt solution was used to hydrodissect and hydrodelineate the nucleus.   Phacoemulsification was then used in stop and chop fashion to remove the lens nucleus and epinucleus.  The remaining cortex  was then removed using the irrigation and aspiration handpiece. Viscoelastic was then placed into the capsular bag to distend it for lens placement.  A lens was then injected into the capsular bag.  The remaining viscoelastic was aspirated.   Wounds were hydrated with balanced salt solution.  The anterior chamber was inflated to a physiologic pressure with balanced salt solution.   Intracameral vigamox 0.1 mL undiluted was injected into the eye and a drop placed onto the ocular surface.  No wound leaks were noted.  The patient was taken to the recovery room in stable condition without complications of anesthesia or surgery  Benay Pillow 07/09/2022, 10:45 AM

## 2022-07-09 NOTE — Anesthesia Postprocedure Evaluation (Signed)
Anesthesia Post Note  Patient: Samantha Dennis  Procedure(s) Performed: CATARACT EXTRACTION PHACO AND INTRAOCULAR LENS PLACEMENT (IOC) RIGHT (Right: Eye)  Patient location during evaluation: PACU Anesthesia Type: MAC Level of consciousness: awake and alert Pain management: pain level controlled Vital Signs Assessment: post-procedure vital signs reviewed and stable Respiratory status: spontaneous breathing, nonlabored ventilation and respiratory function stable Cardiovascular status: stable and blood pressure returned to baseline Postop Assessment: no apparent nausea or vomiting Anesthetic complications: no   No notable events documented.   Last Vitals:  Vitals:   07/09/22 1045 07/09/22 1052  BP: (!) 140/74 (!) 144/77  Pulse: (!) 126 (!) 58  Resp: 18 15  Temp: (!) 36.3 C 36.6 C  SpO2: 98% 100%    Last Pain:  Vitals:   07/09/22 1052  TempSrc:   PainSc: 0-No pain                 Iran Ouch

## 2022-07-10 ENCOUNTER — Encounter: Payer: Self-pay | Admitting: Ophthalmology

## 2022-07-11 ENCOUNTER — Encounter: Payer: Self-pay | Admitting: Ophthalmology

## 2022-07-13 ENCOUNTER — Ambulatory Visit: Payer: Medicare HMO | Admitting: Nurse Practitioner

## 2022-07-19 DIAGNOSIS — H2512 Age-related nuclear cataract, left eye: Secondary | ICD-10-CM | POA: Diagnosis not present

## 2022-07-19 NOTE — Discharge Instructions (Signed)
   Cataract Surgery, Care After ? ?This sheet gives you information about how to care for yourself after your surgery.  Your ophthalmologist may also give you more specific instructions.  If you have problems or questions, contact your doctor at [facility], [phone]. ? ?What can I expect after the surgery? ?It is common to have: ?Itching ?Foreign body sensation (feels like a grain of sand in the eye) ?Watery discharge (excess tearing) ?Sensitivity to light and touch ?Bruising in or around the eye ?Mild blurred vision ? ?Follow these instructions at home: ?Do not touch or rub your eyes. ?You may be told to wear a protective shield or sunglasses to protect your eyes. ?Do not put a contact lens in the operative eye unless your doctor approves. ?Keep the lids and face clean and dry. ?Do not allow water to hit you directly in the face while showering. ?Keep soap and shampoo out of your eyes. ?Do not use eye makeup for 1 week. ? ?Check your eye every day for signs of infection.  Watch for: ?Redness, swelling, or pain. ?Fluid, blood or pus. ?Worsening vision. ?Worsening sensitivity to light or touch. ? ?Activity: ?During the first day, avoid bending over and reading.  You may resume reading and bending the next day. ?Do not drive or use heavy machinery for at least 24 hours. ?Avoid strenuous activities for 1 week.  Activities such as walking, treadmill, exercise bike, and climbing stairs are okay. ?Do not lift heavy (>20 pound) objects for 1 week. ?Do not do yardwork, gardening, or dirty housework (mopping, cleaning bathrooms, vacuuming, etc.) for 1 week. ?Do not swim or use a hot tub for 2 weeks. ?Ask your doctor when you can return to work. ? ?General Instructions: ?Take or apply prescription and over-the-counter medicines as directed by your doctor, including eyedrops and ointments. ?Resume medications discontinued prior to surgery, unless told otherwise by your doctor. ?Keep all follow up appointments as  scheduled. ? ?Contact a health care provider if: ?You have increased bruising around your eye. ?You have pain that is not helped with medication. ?You have a fever. ?You have fluid, pus, or blood coming from your eye or incision. ?Your sensitivity to light gets worse. ?You have spots (floaters) of flashing lights in your vision. ?You have nausea or vomiting. ? ?Go to the nearest emergency room or call 911 if: ?You have sudden loss of vision. ?You have severe, worsening eye pain. ? ?

## 2022-07-23 ENCOUNTER — Encounter: Payer: Self-pay | Admitting: Ophthalmology

## 2022-07-23 ENCOUNTER — Ambulatory Visit: Payer: Medicare HMO | Admitting: Anesthesiology

## 2022-07-23 ENCOUNTER — Encounter
Admission: RE | Disposition: A | Discharge status: Home / Self Care | Payer: Self-pay | Source: Home / Self Care | Attending: Ophthalmology

## 2022-07-23 ENCOUNTER — Other Ambulatory Visit: Payer: Self-pay

## 2022-07-23 ENCOUNTER — Ambulatory Visit
Admission: RE | Admit: 2022-07-23 | Discharge: 2022-07-23 | Disposition: A | Discharge status: Home / Self Care | Payer: Medicare HMO | Attending: Ophthalmology | Admitting: Ophthalmology

## 2022-07-23 DIAGNOSIS — E039 Hypothyroidism, unspecified: Secondary | ICD-10-CM | POA: Insufficient documentation

## 2022-07-23 DIAGNOSIS — I10 Essential (primary) hypertension: Secondary | ICD-10-CM | POA: Insufficient documentation

## 2022-07-23 DIAGNOSIS — M810 Age-related osteoporosis without current pathological fracture: Secondary | ICD-10-CM | POA: Insufficient documentation

## 2022-07-23 DIAGNOSIS — H269 Unspecified cataract: Secondary | ICD-10-CM | POA: Diagnosis not present

## 2022-07-23 DIAGNOSIS — H2512 Age-related nuclear cataract, left eye: Secondary | ICD-10-CM | POA: Insufficient documentation

## 2022-07-23 HISTORY — PX: CATARACT EXTRACTION W/PHACO: SHX586

## 2022-07-23 SURGERY — PHACOEMULSIFICATION, CATARACT, WITH IOL INSERTION
Anesthesia: Monitor Anesthesia Care | Site: Eye | Laterality: Left

## 2022-07-23 MED ORDER — SIGHTPATH DOSE#1 NA HYALUR & NA CHOND-NA HYALUR IO KIT
PACK | INTRAOCULAR | Status: DC | PRN
  Administered 2022-07-23: 1 via OPHTHALMIC

## 2022-07-23 MED ORDER — MOXIFLOXACIN HCL 0.5 % OP SOLN
OPHTHALMIC | Status: DC | PRN
  Administered 2022-07-23: .2 mL via OPHTHALMIC

## 2022-07-23 MED ORDER — TETRACAINE HCL 0.5 % OP SOLN
1.0000 [drp] | OPHTHALMIC | Status: DC | PRN
  Administered 2022-07-23 (×3): 1 [drp] via OPHTHALMIC

## 2022-07-23 MED ORDER — ARMC OPHTHALMIC DILATING DROPS
1.0000 | OPHTHALMIC | Status: DC | PRN
  Administered 2022-07-23 (×3): 1 via OPHTHALMIC

## 2022-07-23 MED ORDER — FENTANYL CITRATE (PF) 100 MCG/2ML IJ SOLN
INTRAMUSCULAR | Status: DC | PRN
  Administered 2022-07-23: 50 ug via INTRAVENOUS

## 2022-07-23 MED ORDER — LACTATED RINGERS IV SOLN: INTRAVENOUS | Status: DC

## 2022-07-23 MED ORDER — LIDOCAINE HCL (PF) 2 % IJ SOLN
INTRAOCULAR | Status: DC | PRN
  Administered 2022-07-23: 1 mL via INTRAOCULAR

## 2022-07-23 MED ORDER — SIGHTPATH DOSE#1 BSS IO SOLN
INTRAOCULAR | Status: DC | PRN
  Administered 2022-07-23: 99 mL via OPHTHALMIC

## 2022-07-23 MED ORDER — SIGHTPATH DOSE#1 BSS IO SOLN
INTRAOCULAR | Status: DC | PRN
  Administered 2022-07-23: 15 mL

## 2022-07-23 SURGICAL SUPPLY — 13 items
CATARACT SUITE SIGHTPATH (MISCELLANEOUS) ×1 IMPLANT
DISSECTOR HYDRO NUCLEUS 50X22 (MISCELLANEOUS) ×1 IMPLANT
FEE CATARACT SUITE SIGHTPATH (MISCELLANEOUS) ×1 IMPLANT
GLOVE SURG GAMMEX PI TX LF 7.5 (GLOVE) ×1 IMPLANT
GLOVE SURG SYN 8.5  E (GLOVE) ×1
GLOVE SURG SYN 8.5 E (GLOVE) ×1 IMPLANT
GLOVE SURG SYN 8.5 PF PI (GLOVE) ×1 IMPLANT
LENS IOL TECNIS EYHANCE 21.5 (Intraocular Lens) IMPLANT
NDL FILTER BLUNT 18X1 1/2 (NEEDLE) ×1 IMPLANT
NEEDLE FILTER BLUNT 18X1 1/2 (NEEDLE) ×1 IMPLANT
SYR 3ML LL SCALE MARK (SYRINGE) ×1 IMPLANT
SYR 5ML LL (SYRINGE) ×1 IMPLANT
WATER STERILE IRR 250ML POUR (IV SOLUTION) ×1 IMPLANT

## 2022-07-23 NOTE — Anesthesia Postprocedure Evaluation (Signed)
Anesthesia Post Note  Patient: Samantha Dennis  Procedure(s) Performed: CATARACT EXTRACTION PHACO AND INTRAOCULAR LENS PLACEMENT (IOC) LEFT  4.70  00:37.0 (Left: Eye)  Patient location during evaluation: PACU Anesthesia Type: MAC Level of consciousness: awake and alert Pain management: pain level controlled Vital Signs Assessment: post-procedure vital signs reviewed and stable Respiratory status: spontaneous breathing, nonlabored ventilation, respiratory function stable and patient connected to nasal cannula oxygen Cardiovascular status: stable and blood pressure returned to baseline Postop Assessment: no apparent nausea or vomiting Anesthetic complications: no   No notable events documented.   Last Vitals:  Vitals:   07/23/22 1329 07/23/22 1330  BP: (!) 141/75 (!) 153/74  Pulse: (!) 58 60  Resp: 17 19  Temp: (!) 36.3 C   SpO2: 100% 100%    Last Pain:  Vitals:   07/23/22 1330  TempSrc:   PainSc: 0-No pain                 Martha Clan

## 2022-07-23 NOTE — Anesthesia Preprocedure Evaluation (Signed)
Anesthesia Evaluation  Patient identified by MRN, date of birth, ID band Patient awake    Reviewed: Allergy & Precautions, H&P , NPO status , Patient's Chart, lab work & pertinent test results  Airway Mallampati: II  TM Distance: >3 FB Neck ROM: full    Dental  (+) Caps   Pulmonary neg pulmonary ROS   Pulmonary exam normal        Cardiovascular hypertension, Normal cardiovascular exam     Neuro/Psych negative neurological ROS  negative psych ROS   GI/Hepatic negative GI ROS, Neg liver ROS,,,  Endo/Other  Hypothyroidism    Renal/GU negative Renal ROS  negative genitourinary   Musculoskeletal  (+) Arthritis ,    Abdominal Normal abdominal exam  (+)   Peds  Hematology negative hematology ROS (+)   Anesthesia Other Findings Past Medical History: No date: Arthritis     Comment:  Right shoulder No date: Hay fever No date: Hypertension No date: Hypothyroidism No date: Osteoporosis No date: UTI (urinary tract infection)     Comment:  last one 06/2019 No date: Wears hearing aid in both ears  Past Surgical History: 11/04/2019: LUMBAR LAMINECTOMY/DECOMPRESSION MICRODISCECTOMY; Right     Comment:  Procedure: Laminectomy for facet/synovial cyst - Lumbar               Four- Lumbar Five - right;  Surgeon: Eustace Moore, MD;               Location: [facility];  Service: Neurosurgery;  Laterality:               Right;  Laminectomy for facet/synovial cyst - Lumbar               Four- Lumbar Five - right 1984: TOTAL ABDOMINAL HYSTERECTOMY; Bilateral  BMI    Body Mass Index: 25.19 kg/m      Reproductive/Obstetrics negative OB ROS                              Anesthesia Physical Anesthesia Plan  ASA: 2  Anesthesia Plan: MAC   Post-op Pain Management:    Induction:   PONV Risk Score and Plan:   Airway Management Planned: Natural Airway  Additional Equipment:   Intra-op Plan:    Post-operative Plan:   Informed Consent: I have reviewed the patients History and Physical, chart, labs and discussed the procedure including the risks, benefits and alternatives for the proposed anesthesia with the patient or authorized representative who has indicated his/her understanding and acceptance.     Dental Advisory Given  Plan Discussed with: CRNA and Surgeon  Anesthesia Plan Comments:         Anesthesia Quick Evaluation

## 2022-07-23 NOTE — Op Note (Signed)
OPERATIVE NOTE  Samantha Dennis [id number] 07/23/2022   PREOPERATIVE DIAGNOSIS:  Nuclear sclerotic cataract left eye.  H25.12   POSTOPERATIVE DIAGNOSIS:    Nuclear sclerotic cataract left eye.     PROCEDURE:  Phacoemusification with posterior chamber intraocular lens placement of the left eye   LENS:   Implant Name Type Inv. Item Serial No. Manufacturer Lot No. LRB No. Used Action  LENS IOL TECNIS EYHANCE 21.5 - [id number] Intraocular Lens LENS IOL TECNIS EYHANCE 21.5 [id number] [employer]  Left 1 Implanted      Procedure(s): CATARACT EXTRACTION PHACO AND INTRAOCULAR LENS PLACEMENT (IOC) LEFT  4.70  00:37.0 (Left)  DIB00 +21.5   ULTRASOUND TIME: 0 minutes 37 seconds.  CDE 4.70   SURGEON:  Benay Pillow, MD, MPH   ANESTHESIA:  Topical with tetracaine drops augmented with 1% preservative-free intracameral lidocaine.  ESTIMATED BLOOD LOSS: <1 mL   COMPLICATIONS:  None.   DESCRIPTION OF PROCEDURE:  The patient was identified in the holding room and transported to the operating room and placed in the supine position under the operating microscope.  The left eye was identified as the operative eye and it was prepped and draped in the usual sterile ophthalmic fashion.   A 1.0 millimeter clear-corneal paracentesis was made at the 5:00 position. 0.5 ml of preservative-free 1% lidocaine with epinephrine was injected into the anterior chamber.  The anterior chamber was filled with viscoelastic.  A 2.4 millimeter keratome was used to make a near-clear corneal incision at the 2:00 position.  A curvilinear capsulorrhexis was made with a cystotome and capsulorrhexis forceps.  Balanced salt solution was used to hydrodissect and hydrodelineate the nucleus.   Phacoemulsification was then used in stop and chop fashion to remove the lens nucleus and epinucleus.  The remaining cortex was then removed using the irrigation and aspiration handpiece. Viscoelastic was then placed into the capsular bag to  distend it for lens placement.  A lens was then injected into the capsular bag.  The remaining viscoelastic was aspirated.   Wounds were hydrated with balanced salt solution.  The anterior chamber was inflated to a physiologic pressure with balanced salt solution.  Intracameral vigamox 0.1 mL undiltued was injected into the eye and a drop placed onto the ocular surface.  No wound leaks were noted.  The patient was taken to the recovery room in stable condition without complications of anesthesia or surgery  Benay Pillow 07/23/2022, 1:27 PM

## 2022-07-23 NOTE — Transfer of Care (Signed)
Immediate Anesthesia Transfer of Care Note  Patient: Samantha Dennis  Procedure(s) Performed: CATARACT EXTRACTION PHACO AND INTRAOCULAR LENS PLACEMENT (IOC) LEFT  4.70  00:37.0 (Left: Eye)  Patient Location: PACU  Anesthesia Type:MAC  Level of Consciousness: awake, alert , and oriented  Airway & Oxygen Therapy: Patient Spontanous Breathing  Post-op Assessment: Report given to RN and Post -op Vital signs reviewed and stable  Post vital signs: Reviewed and stable  Last Vitals:  Vitals Value Taken Time  BP 141/75 07/23/22 1329  Temp 36.3 C 07/23/22 1329  Pulse 60 07/23/22 1329  Resp 19 07/23/22 1329  SpO2 100 % 07/23/22 1329  Vitals shown include unvalidated device data.  Last Pain:  Vitals:   07/23/22 1329  TempSrc:   PainSc: 0-No pain         Complications: No notable events documented.

## 2022-07-23 NOTE — H&P (Signed)
[facility]   Primary Care Physician:  [name], MD Ophthalmologist: Dr. Benay Pillow  Pre-Procedure History & Physical: HPI:  Samantha Dennis is a 89 y.o. female here for cataract surgery.   Past Medical History:  Diagnosis Date   Arthritis    Right shoulder   Hay fever    Hypertension    Hypothyroidism    Osteoporosis    UTI (urinary tract infection)    last one 06/2019   Wears hearing aid in both ears     Past Surgical History:  Procedure Laterality Date   CATARACT EXTRACTION W/PHACO Right 07/09/2022   Procedure: CATARACT EXTRACTION PHACO AND INTRAOCULAR LENS PLACEMENT ([place]) RIGHT;  Surgeon: Eulogio Bear, MD;  Location: [facility];  Service: Ophthalmology;  Laterality: Right;  3.77 0:35.1   LUMBAR LAMINECTOMY/DECOMPRESSION MICRODISCECTOMY Right 11/04/2019   Procedure: Laminectomy for facet/synovial cyst - Lumbar Four- Lumbar Five - right;  Surgeon: Eustace Moore, MD;  Location: [facility];  Service: Neurosurgery;  Laterality: Right;  Laminectomy for facet/synovial cyst - Lumbar Four- Lumbar Five - right   TOTAL ABDOMINAL HYSTERECTOMY Bilateral 1984    Prior to Admission medications   Medication Sig Start Date End Date Taking? Authorizing Provider  BIOTIN PO Take by mouth daily.   Yes [provider]  Cholecalciferol (VITAMIN D) 125 MCG (5000 UT) CAPS Take 5,000 Units by mouth daily.   Yes [provider]  famciclovir (FAMVIR) 250 MG tablet TAKE 1 TABLET EVERY DAY 12/02/21  Yes [name], MD  ibandronate (BONIVA) 150 MG tablet TAKE 1 TABLET EVERY MONTH ON AN EMPTY STOMACH. DO NOT LIE DOWN FOR AT LEAST 1 HOUR AFTER TAKING DOSE 01/22/22  Yes Abernathy, Alyssa, NP  levothyroxine (SYNTHROID) 75 MCG tablet TAKE 1 TABLET EVERY DAY BEFORE BREAKFAST 01/17/22  Yes Abernathy, Alyssa, NP  lisinopril-hydrochlorothiazide (ZESTORETIC) 10-12.5 MG tablet Take 1 tablet by mouth daily. 01/29/22  Yes Abernathy, Yetta Flock, NP  Multiple Vitamins-Minerals  (PRESERVISION AREDS 2) CAPS Take 2 capsules by mouth daily.   Yes [provider]  rosuvastatin (CRESTOR) 5 MG tablet Take 1 tablet (5 mg total) by mouth daily. 01/29/22  Yes Jonetta Osgood, NP    Allergies as of 06/20/2022   (No Known Allergies)    Family History  Problem Relation Age of Onset   Breast cancer Neg Hx     Social History   Socioeconomic History   Marital status: Married    Spouse name: Not on file   Number of children: Not on file   Years of education: Not on file   Highest education level: Not on file  Occupational History   Not on file  Tobacco Use   Smoking status: Never   Smokeless tobacco: Never  Vaping Use   Vaping Use: Never used  Substance and Sexual Activity   Alcohol use: Yes    Alcohol/week: 1.0 standard drink of alcohol    Types: 1 Glasses of wine per week    Comment: ocassionally    Drug use: No   Sexual activity: Not on file  Other Topics Concern   Not on file  Social History Narrative   Not on file   Social Determinants of Health   Financial Resource Strain: Not on file  Food Insecurity: Not on file  Transportation Needs: Not on file  Physical Activity: Not on file  Stress: Not on file  Social Connections: Not on file  Intimate Partner Violence: Not on file    Review  of Systems: See HPI, otherwise negative ROS  Physical Exam: BP (!) 163/77   Pulse 63   Temp 98.7 F (37.1 C) (Temporal)   Resp 18   Ht 5' (1.524 m)   Wt 57.9 kg   SpO2 99%   BMI 24.94 kg/m  General:   Alert, cooperative in NAD Head:  Normocephalic and atraumatic. Respiratory:  Normal work of breathing. Cardiovascular:  RRR  Impression/Plan: Samantha Dennis is here for cataract surgery.  Risks, benefits, limitations, and alternatives regarding cataract surgery have been reviewed with the patient.  Questions have been answered.  All parties agreeable.   Benay Pillow, MD  07/23/2022, 1:02 PM

## 2022-07-24 ENCOUNTER — Encounter: Payer: Self-pay | Admitting: Ophthalmology

## 2022-08-01 ENCOUNTER — Ambulatory Visit (INDEPENDENT_AMBULATORY_CARE_PROVIDER_SITE_OTHER): Payer: Medicare HMO | Admitting: Nurse Practitioner

## 2022-08-01 ENCOUNTER — Encounter: Payer: Self-pay | Admitting: Nurse Practitioner

## 2022-08-01 VITALS — BP 140/78 | HR 65 | Temp 97.9°F | Resp 16 | Ht 60.0 in | Wt 128.0 lb

## 2022-08-01 DIAGNOSIS — M810 Age-related osteoporosis without current pathological fracture: Secondary | ICD-10-CM | POA: Diagnosis not present

## 2022-08-01 DIAGNOSIS — Z0001 Encounter for general adult medical examination with abnormal findings: Secondary | ICD-10-CM

## 2022-08-01 DIAGNOSIS — E782 Mixed hyperlipidemia: Secondary | ICD-10-CM | POA: Diagnosis not present

## 2022-08-01 DIAGNOSIS — E039 Hypothyroidism, unspecified: Secondary | ICD-10-CM | POA: Diagnosis not present

## 2022-08-01 DIAGNOSIS — I10 Essential (primary) hypertension: Secondary | ICD-10-CM

## 2022-08-01 MED ORDER — LISINOPRIL-HYDROCHLOROTHIAZIDE 10-12.5 MG PO TABS: 1.0000 | ORAL_TABLET | Freq: Every day | ORAL | 1 refills | Status: DC

## 2022-08-01 MED ORDER — ROSUVASTATIN CALCIUM 5 MG PO TABS: 5.0000 mg | ORAL_TABLET | Freq: Every day | ORAL | 1 refills | Status: DC

## 2022-08-01 MED ORDER — LEVOTHYROXINE SODIUM 75 MCG PO TABS: ORAL_TABLET | ORAL | 1 refills | Status: DC

## 2022-08-01 NOTE — Progress Notes (Signed)
[facility] [place], [place] [address]  Internal MEDICINE  Office Visit Note  Patient Name: Samantha Dennis  [id number]  [id number]  Date of Service: 08/01/2022  Chief Complaint  Patient presents with   Medicare Wellness   Hypertension    HPI Samantha Dennis presents for an annual well visit and physical exam.  Well-appearing 89 y.o. female with high cholesterol, hypertension, hypothyroidism, and osteoporosis Routine mammogram: July 2023 DEXA scan:  July 2023 with osteoporosis of the left forearm.  Labs: due for labs  New or worsening pain: right shoulder arthritis Other concerns: none Has had cataract surgery on both eyes in January, has hearing aids now  No other concerns       08/01/2022   11:04 AM 07/28/2021   10:07 AM 07/27/2020   11:40 AM  MMSE - Mini Mental State Exam  Orientation to time '5 5 5  '$ Orientation to Place '5 5 5  '$ Registration '3 3 3  '$ Attention/ Calculation '5 5 5  '$ Recall '3 3 3  '$ Language- name 2 objects '2 2 2  '$ Language- repeat '1 1 1  '$ Language- follow 3 step command '3 3 3  '$ Language- read & follow direction '1 1 1  '$ Write a sentence '1 1 1  '$ Copy design '1 1 1  '$ Total score '30 30 30    '$ Functional Status Survey: Is the patient deaf or have difficulty hearing?: Yes Does the patient have difficulty seeing, even when wearing glasses/contacts?: No Does the patient have difficulty concentrating, remembering, or making decisions?: No Does the patient have difficulty walking or climbing stairs?: No Does the patient have difficulty dressing or bathing?: No Does the patient have difficulty doing errands alone such as visiting a doctor's office or shopping?: No     07/27/2020   11:37 AM 01/24/2021   11:53 AM 07/28/2021   10:04 AM 01/29/2022   10:48 AM 08/01/2022   11:03 AM  Fall Risk  Falls in the past year? 0 0 0 0 0  Was there an injury with Fall?     0  Fall Risk Category Calculator     0  (RETIRED) Patient Fall Risk Level   Low fall risk    Patient at Risk  for Falls Due to  No Fall Risks No Fall Risks  No Fall Risks  Fall risk Follow up  Falls evaluation completed Falls evaluation completed  Falls evaluation completed       08/01/2022   11:03 AM  Depression screen PHQ 2/9  Decreased Interest 0  Down, Depressed, Hopeless 0  PHQ - 2 Score 0        No data to display            Current Medication: Outpatient Encounter Medications as of 08/01/2022  Medication Sig   BIOTIN PO Take by mouth daily.   Cholecalciferol (VITAMIN D) 125 MCG (5000 UT) CAPS Take 5,000 Units by mouth daily.   famciclovir (FAMVIR) 250 MG tablet TAKE 1 TABLET EVERY DAY   ibandronate (BONIVA) 150 MG tablet TAKE 1 TABLET EVERY MONTH ON AN EMPTY STOMACH. DO NOT LIE DOWN FOR AT LEAST 1 HOUR AFTER TAKING DOSE   Multiple Vitamins-Minerals (PRESERVISION AREDS 2) CAPS Take 2 capsules by mouth daily.   [DISCONTINUED] levothyroxine (SYNTHROID) 75 MCG tablet TAKE 1 TABLET EVERY DAY BEFORE BREAKFAST   [DISCONTINUED] lisinopril-hydrochlorothiazide (ZESTORETIC) 10-12.5 MG tablet Take 1 tablet by mouth daily.   [DISCONTINUED] rosuvastatin (CRESTOR) 5 MG tablet Take 1 tablet (5 mg total)  by mouth daily.   levothyroxine (SYNTHROID) 75 MCG tablet TAKE 1 TABLET EVERY DAY BEFORE BREAKFAST   lisinopril-hydrochlorothiazide (ZESTORETIC) 10-12.5 MG tablet Take 1 tablet by mouth daily.   rosuvastatin (CRESTOR) 5 MG tablet Take 1 tablet (5 mg total) by mouth daily.   No facility-administered encounter medications on file as of 08/01/2022.    Surgical History: Past Surgical History:  Procedure Laterality Date   CATARACT EXTRACTION W/PHACO Right 07/09/2022   Procedure: CATARACT EXTRACTION PHACO AND INTRAOCULAR LENS PLACEMENT ([place]) RIGHT;  Surgeon: Eulogio Bear, MD;  Location: [facility];  Service: Ophthalmology;  Laterality: Right;  3.77 0:35.1   CATARACT EXTRACTION W/PHACO Left 07/23/2022   Procedure: CATARACT EXTRACTION PHACO AND INTRAOCULAR LENS PLACEMENT ([facility]) LEFT  4.70   00:37.0;  Surgeon: Eulogio Bear, MD;  Location: [facility];  Service: Ophthalmology;  Laterality: Left;   LUMBAR LAMINECTOMY/DECOMPRESSION MICRODISCECTOMY Right 11/04/2019   Procedure: Laminectomy for facet/synovial cyst - Lumbar Four- Lumbar Five - right;  Surgeon: Eustace Moore, MD;  Location: [facility];  Service: Neurosurgery;  Laterality: Right;  Laminectomy for facet/synovial cyst - Lumbar Four- Lumbar Five - right   TOTAL ABDOMINAL HYSTERECTOMY Bilateral 1984    Medical History: Past Medical History:  Diagnosis Date   Arthritis    Right shoulder   Hay fever    Hypertension    Hypothyroidism    Osteoporosis    UTI (urinary tract infection)    last one 06/2019   Wears hearing aid in both ears     Family History: Family History  Problem Relation Age of Onset   Breast cancer Neg Hx     Social History   Socioeconomic History   Marital status: Married    Spouse name: Not on file   Number of children: Not on file   Years of education: Not on file   Highest education level: Not on file  Occupational History   Not on file  Tobacco Use   Smoking status: Never   Smokeless tobacco: Never  Vaping Use   Vaping Use: Never used  Substance and Sexual Activity   Alcohol use: Yes    Alcohol/week: 1.0 standard drink of alcohol    Types: 1 Glasses of wine per week    Comment: ocassionally    Drug use: No   Sexual activity: Not on file  Other Topics Concern   Not on file  Social History Narrative   Not on file   Social Determinants of Health   Financial Resource Strain: Not on file  Food Insecurity: Not on file  Transportation Needs: Not on file  Physical Activity: Not on file  Stress: Not on file  Social Connections: Not on file  Intimate Partner Violence: Not on file      Review of Systems  Constitutional:  Negative for activity change, appetite change, chills, fatigue, fever and unexpected weight change.  HENT: Negative.  Negative for congestion, ear  pain, rhinorrhea, sore throat and trouble swallowing.   Eyes: Negative.   Respiratory: Negative.  Negative for cough, chest tightness, shortness of breath and wheezing.   Cardiovascular: Negative.  Negative for chest pain.  Gastrointestinal: Negative.  Negative for abdominal pain, blood in stool, constipation, diarrhea, nausea and vomiting.  Endocrine: Negative.   Genitourinary: Negative.  Negative for difficulty urinating, dysuria, frequency, hematuria and urgency.  Musculoskeletal:  Positive for arthralgias (right shoulder). Negative for back pain, joint swelling, myalgias and neck pain.  Skin: Negative.  Negative for rash and  wound.  Allergic/Immunologic: Negative.  Negative for immunocompromised state.  Neurological: Negative.  Negative for dizziness, seizures, numbness and headaches.  Hematological: Negative.   Psychiatric/Behavioral: Negative.  Negative for behavioral problems, self-injury and suicidal ideas. The patient is not nervous/anxious.     Vital Signs: BP (!) 140/78   Pulse 65   Temp 97.9 F (36.6 C)   Resp 16   Ht 5' (1.524 m)   Wt 128 lb (58.1 kg)   SpO2 96%   BMI 25.00 kg/m    Physical Exam Vitals reviewed.  Constitutional:      General: She is awake. She is not in acute distress.    Appearance: Normal appearance. She is well-developed, well-groomed and normal weight. She is not ill-appearing or diaphoretic.  HENT:     Head: Normocephalic and atraumatic.     Right Ear: Tympanic membrane, ear canal and external ear normal.     Left Ear: Tympanic membrane, ear canal and external ear normal.     Nose: Nose normal. No congestion or rhinorrhea.     Mouth/Throat:     Lips: Pink.     Mouth: Mucous membranes are moist.     Pharynx: Oropharynx is clear. Uvula midline. No oropharyngeal exudate or posterior oropharyngeal erythema.  Eyes:     General: Lids are normal. Vision grossly intact. Gaze aligned appropriately. No scleral icterus.       Right eye: No  discharge.        Left eye: No discharge.     Extraocular Movements: Extraocular movements intact.     Conjunctiva/sclera: Conjunctivae normal.     Pupils: Pupils are equal, round, and reactive to light.     Funduscopic exam:    Right eye: Red reflex present.        Left eye: Red reflex present. Neck:     Thyroid: No thyromegaly.     Vascular: No JVD.     Trachea: Trachea and phonation normal. No tracheal deviation.  Cardiovascular:     Rate and Rhythm: Normal rate and regular rhythm.     Pulses: Normal pulses.     Heart sounds: Normal heart sounds, S1 normal and S2 normal. No murmur heard.    No friction rub. No gallop.  Pulmonary:     Effort: Pulmonary effort is normal. No accessory muscle usage or respiratory distress.     Breath sounds: Normal breath sounds and air entry. No stridor. No wheezing or rales.  Chest:     Chest wall: No tenderness.     Comments: Declined clinical breast exam. Abdominal:     General: Bowel sounds are normal. There is no distension.     Palpations: Abdomen is soft. There is no shifting dullness, fluid wave, mass or pulsatile mass.     Tenderness: There is no abdominal tenderness. There is no guarding or rebound.  Musculoskeletal:        General: No tenderness or deformity. Normal range of motion.     Cervical back: Normal range of motion and neck supple.     Right lower leg: No edema.     Left lower leg: No edema.  Lymphadenopathy:     Cervical: No cervical adenopathy.  Skin:    General: Skin is warm and dry.     Capillary Refill: Capillary refill takes less than 2 seconds.     Coloration: Skin is not pale.     Findings: No erythema or rash.  Neurological:     Mental Status: She is  alert and oriented to person, place, and time.     Cranial Nerves: No cranial nerve deficit.     Motor: No abnormal muscle tone.     Coordination: Coordination normal.     Deep Tendon Reflexes: Reflexes are normal and symmetric.  Psychiatric:        Mood and  Affect: Mood normal.        Behavior: Behavior normal. Behavior is cooperative.        Thought Content: Thought content normal.        Judgment: Judgment normal.        Assessment/Plan: 1. Encounter for routine adult health examination with abnormal findings Age-appropriate preventive screenings and vaccinations discussed, annual physical exam completed. Routine labs for health maintenance ordered, see below. PHM updated.  - TSH + free T4 - Lipid Profile - CBC with Differential/Platelet - CMP14+EGFR  2. Essential hypertension Stable, continue lisinopril-HCTZ as prescribed. Routine labs ordered - CBC with Differential/Platelet - CMP14+EGFR - lisinopril-hydrochlorothiazide (ZESTORETIC) 10-12.5 MG tablet; Take 1 tablet by mouth daily.  Dispense: 90 tablet; Refill: 1  3. Acquired hypothyroidism Routine labs ordered.  Continue levothyroxine as prescribed.  - TSH + free T4 - Lipid Profile - CBC with Differential/Platelet - CMP14+EGFR - levothyroxine (SYNTHROID) 75 MCG tablet; TAKE 1 TABLET EVERY DAY BEFORE BREAKFAST  Dispense: 90 tablet; Refill: 1  4. Age-related osteoporosis without current pathological fracture Takes boniva once a month, continue as prescribed.   5. Mixed hyperlipidemia Repeat lipid panel Continue rosuvastatin as prescribed. - Lipid Profile - rosuvastatin (CRESTOR) 5 MG tablet; Take 1 tablet (5 mg total) by mouth daily.  Dispense: 90 tablet; Refill: 1     General Counseling: Samantha Dennis verbalizes understanding of the findings of todays visit and agrees with plan of treatment. I have discussed any further diagnostic evaluation that may be needed or ordered today. We also reviewed her medications today. she has been encouraged to call the office with any questions or concerns that should arise related to todays visit.    Orders Placed This Encounter  Procedures   TSH + free T4   Lipid Profile   CBC with Differential/Platelet   CMP14+EGFR    Meds ordered  this encounter  Medications   levothyroxine (SYNTHROID) 75 MCG tablet    Sig: TAKE 1 TABLET EVERY DAY BEFORE BREAKFAST    Dispense:  90 tablet    Refill:  1    For future refills   rosuvastatin (CRESTOR) 5 MG tablet    Sig: Take 1 tablet (5 mg total) by mouth daily.    Dispense:  90 tablet    Refill:  1    For future refills   lisinopril-hydrochlorothiazide (ZESTORETIC) 10-12.5 MG tablet    Sig: Take 1 tablet by mouth daily.    Dispense:  90 tablet    Refill:  1    For future refills    Return in about 6 months (around 01/30/2023) for F/U, Samantha Dennis PCP.   Total time spent:30 Minutes Time spent includes review of chart, medications, test results, and follow up plan with the patient.   [place] Controlled Substance Database was reviewed by me.  This patient was seen by Samantha Osgood, FNP-C in collaboration with Dr. Clayborn Bigness as a part of collaborative care agreement.  Samantha Zobrist R. Valetta Fuller, MSN, FNP-C Internal medicine

## 2022-08-15 DIAGNOSIS — Z0100 Encounter for examination of eyes and vision without abnormal findings: Secondary | ICD-10-CM | POA: Diagnosis not present

## 2022-08-24 DIAGNOSIS — M7551 Bursitis of right shoulder: Secondary | ICD-10-CM | POA: Diagnosis not present

## 2022-09-17 DIAGNOSIS — M25562 Pain in left knee: Secondary | ICD-10-CM | POA: Diagnosis not present

## 2022-09-18 ENCOUNTER — Ambulatory Visit (INDEPENDENT_AMBULATORY_CARE_PROVIDER_SITE_OTHER): Payer: Medicare HMO | Admitting: Internal Medicine

## 2022-09-18 ENCOUNTER — Encounter: Payer: Self-pay | Admitting: Internal Medicine

## 2022-09-18 VITALS — BP 130/59 | HR 104 | Temp 98.3°F | Resp 16 | Ht 60.0 in | Wt 124.6 lb

## 2022-09-18 DIAGNOSIS — I10 Essential (primary) hypertension: Secondary | ICD-10-CM | POA: Diagnosis not present

## 2022-09-18 DIAGNOSIS — N3000 Acute cystitis without hematuria: Secondary | ICD-10-CM

## 2022-09-18 DIAGNOSIS — N342 Other urethritis: Secondary | ICD-10-CM

## 2022-09-18 MED ORDER — NITROFURANTOIN MONOHYD MACRO 100 MG PO CAPS: ORAL_CAPSULE | ORAL | 0 refills | Status: DC

## 2022-09-18 NOTE — Progress Notes (Signed)
[facility] [address] [place], [place] [address]  Internal MEDICINE  Office Visit Note  Patient Name: Samantha Dennis  [id number]  [id number]  Date of Service: 10/08/2022  Chief Complaint  Patient presents with   Acute Visit    Uti    HPI Pt is here with c/o increased urine frequency and lower back pain, did have some left over macrobid and Azo, she has been taking this, thinks medicine was expired  Blood pressure is elevated as well  Denies any fever or chills     Current Medication: Outpatient Encounter Medications as of 09/18/2022  Medication Sig   BIOTIN PO Take by mouth daily.   Cholecalciferol (VITAMIN D) 125 MCG (5000 UT) CAPS Take 5,000 Units by mouth daily.   famciclovir (FAMVIR) 250 MG tablet TAKE 1 TABLET EVERY DAY   ibandronate (BONIVA) 150 MG tablet TAKE 1 TABLET EVERY MONTH ON AN EMPTY STOMACH. DO NOT LIE DOWN FOR AT LEAST 1 HOUR AFTER TAKING DOSE   levothyroxine (SYNTHROID) 75 MCG tablet TAKE 1 TABLET EVERY DAY BEFORE BREAKFAST   lisinopril-hydrochlorothiazide (ZESTORETIC) 10-12.5 MG tablet Take 1 tablet by mouth daily.   Multiple Vitamins-Minerals (PRESERVISION AREDS 2) CAPS Take 2 capsules by mouth daily.   nitrofurantoin, macrocrystal-monohydrate, (MACROBID) 100 MG capsule Use as instructed (Patient taking differently: Take 100 mg by mouth 2 (two) times daily. For 10 days)   rosuvastatin (CRESTOR) 5 MG tablet Take 1 tablet (5 mg total) by mouth daily.   No facility-administered encounter medications on file as of 09/18/2022.    Surgical History: Past Surgical History:  Procedure Laterality Date   CATARACT EXTRACTION W/PHACO Right 07/09/2022   Procedure: CATARACT EXTRACTION PHACO AND INTRAOCULAR LENS PLACEMENT (IOC) RIGHT;  Surgeon: Nevada Crane, MD;  Location: [facility];  Service: Ophthalmology;  Laterality: Right;  3.77 0:35.1   CATARACT EXTRACTION W/PHACO Left 07/23/2022   Procedure: CATARACT EXTRACTION PHACO AND INTRAOCULAR  LENS PLACEMENT (IOC) LEFT  4.70  00:37.0;  Surgeon: Nevada Crane, MD;  Location: [facility];  Service: Ophthalmology;  Laterality: Left;   LUMBAR LAMINECTOMY/DECOMPRESSION MICRODISCECTOMY Right 11/04/2019   Procedure: Laminectomy for facet/synovial cyst - Lumbar Four- Lumbar Five - right;  Surgeon: Tia Alert, MD;  Location: [facility] OR;  Service: Neurosurgery;  Laterality: Right;  Laminectomy for facet/synovial cyst - Lumbar Four- Lumbar Five - right   TOTAL ABDOMINAL HYSTERECTOMY Bilateral 1984    Medical History: Past Medical History:  Diagnosis Date   Arthritis    Right shoulder   Hay fever    Hypertension    Hypothyroidism    Osteoporosis    UTI (urinary tract infection)    last one 06/2019   Wears hearing aid in both ears     Family History: Family History  Problem Relation Age of Onset   Breast cancer Neg Hx     Social History   Socioeconomic History   Marital status: Married    Spouse name: Not on file   Number of children: Not on file   Years of education: Not on file   Highest education level: Not on file  Occupational History   Not on file  Tobacco Use   Smoking status: Never   Smokeless tobacco: Never  Vaping Use   Vaping Use: Never used  Substance and Sexual Activity   Alcohol use: Yes    Alcohol/week: 1.0 standard drink of alcohol    Types: 1 Glasses of wine per week    Comment: ocassionally  Drug use: No   Sexual activity: Not on file  Other Topics Concern   Not on file  Social History Narrative   Not on file   Social Determinants of Health   Financial Resource Strain: Not on file  Food Insecurity: Not on file  Transportation Needs: Not on file  Physical Activity: Not on file  Stress: Not on file  Social Connections: Not on file  Intimate Partner Violence: Not on file      Review of Systems  Constitutional:  Negative for fatigue and fever.  HENT:  Negative for congestion, mouth sores and postnasal drip.   Respiratory:   Negative for cough.   Cardiovascular:  Negative for chest pain.  Genitourinary:  Positive for urgency. Negative for flank pain.  Psychiatric/Behavioral: Negative.      Vital Signs: BP (!) 130/59 Comment: 149/59  Pulse (!) 104   Temp 98.3 F (36.8 C)   Resp 16   Ht 5' (1.524 m)   Wt 124 lb 9.6 oz (56.5 kg)   SpO2 98%   BMI 24.33 kg/m    Physical Exam Constitutional:      Appearance: Normal appearance.  HENT:     Head: Normocephalic and atraumatic.     Nose: Nose normal.     Mouth/Throat:     Mouth: Mucous membranes are moist.     Pharynx: No posterior oropharyngeal erythema.  Eyes:     Extraocular Movements: Extraocular movements intact.     Pupils: Pupils are equal, round, and reactive to light.  Cardiovascular:     Pulses: Normal pulses.     Heart sounds: Normal heart sounds.  Pulmonary:     Effort: Pulmonary effort is normal.     Breath sounds: Normal breath sounds.  Neurological:     General: No focal deficit present.     Mental Status: She is alert.  Psychiatric:        Mood and Affect: Mood normal.        Behavior: Behavior normal.        Assessment/Plan: 1. Acute cystitis without hematuria Await C/S, will contiue to take Macrobid until then  - nitrofurantoin, macrocrystal-monohydrate, (MACROBID) 100 MG capsule; Use as instructed (Patient taking differently: Take 100 mg by mouth 2 (two) times daily. For 10 days)  Dispense: 20 capsule; Refill: 0  2. Essential hypertension Monitor Blood pressure at home, modify therapy if indicated    General Counseling: [name] understanding of the findings of todays visit and agrees with plan of treatment. I have discussed any further diagnostic evaluation that may be needed or ordered today. We also reviewed her medications today. she has been encouraged to call the office with any questions or concerns that should arise related to todays visit.    No orders of the defined types were placed in this  encounter.   Meds ordered this encounter  Medications   nitrofurantoin, macrocrystal-monohydrate, (MACROBID) 100 MG capsule    Sig: Use as instructed    Dispense:  20 capsule    Refill:  0    Total time spent:30 Minutes Time spent includes review of chart, medications, test results, and follow up plan with the patient.   [place] Controlled Substance Database was reviewed by me.   Dr Lyndon Code Internal medicine

## 2022-09-24 DIAGNOSIS — D2361 Other benign neoplasm of skin of right upper limb, including shoulder: Secondary | ICD-10-CM | POA: Diagnosis not present

## 2022-09-24 DIAGNOSIS — L538 Other specified erythematous conditions: Secondary | ICD-10-CM | POA: Diagnosis not present

## 2022-09-24 DIAGNOSIS — D485 Neoplasm of uncertain behavior of skin: Secondary | ICD-10-CM | POA: Diagnosis not present

## 2022-09-24 DIAGNOSIS — L821 Other seborrheic keratosis: Secondary | ICD-10-CM | POA: Diagnosis not present

## 2022-09-24 DIAGNOSIS — C44612 Basal cell carcinoma of skin of right upper limb, including shoulder: Secondary | ICD-10-CM | POA: Diagnosis not present

## 2022-09-24 DIAGNOSIS — L820 Inflamed seborrheic keratosis: Secondary | ICD-10-CM | POA: Diagnosis not present

## 2022-10-02 DIAGNOSIS — C44612 Basal cell carcinoma of skin of right upper limb, including shoulder: Secondary | ICD-10-CM | POA: Diagnosis not present

## 2022-10-08 ENCOUNTER — Encounter: Payer: Self-pay | Admitting: Internal Medicine

## 2022-10-23 ENCOUNTER — Ambulatory Visit: Payer: Medicare HMO | Admitting: Internal Medicine

## 2022-10-23 ENCOUNTER — Other Ambulatory Visit: Payer: Self-pay | Admitting: Internal Medicine

## 2022-11-06 ENCOUNTER — Ambulatory Visit: Payer: Medicare HMO | Admitting: Internal Medicine

## 2022-11-12 ENCOUNTER — Ambulatory Visit: Payer: Medicare HMO | Admitting: Internal Medicine

## 2022-11-14 ENCOUNTER — Other Ambulatory Visit: Payer: Self-pay | Admitting: Nurse Practitioner

## 2022-11-14 DIAGNOSIS — E039 Hypothyroidism, unspecified: Secondary | ICD-10-CM

## 2022-11-22 DIAGNOSIS — H353132 Nonexudative age-related macular degeneration, bilateral, intermediate dry stage: Secondary | ICD-10-CM | POA: Diagnosis not present

## 2022-11-22 DIAGNOSIS — Z0100 Encounter for examination of eyes and vision without abnormal findings: Secondary | ICD-10-CM | POA: Diagnosis not present

## 2022-11-22 DIAGNOSIS — Z961 Presence of intraocular lens: Secondary | ICD-10-CM | POA: Diagnosis not present

## 2022-11-22 DIAGNOSIS — H26493 Other secondary cataract, bilateral: Secondary | ICD-10-CM | POA: Diagnosis not present

## 2022-12-05 DIAGNOSIS — H524 Presbyopia: Secondary | ICD-10-CM | POA: Diagnosis not present

## 2023-01-30 ENCOUNTER — Ambulatory Visit: Payer: Medicare HMO | Admitting: Nurse Practitioner

## 2023-01-30 ENCOUNTER — Other Ambulatory Visit: Payer: Self-pay | Admitting: Nurse Practitioner

## 2023-01-30 DIAGNOSIS — I10 Essential (primary) hypertension: Secondary | ICD-10-CM

## 2023-01-30 DIAGNOSIS — E782 Mixed hyperlipidemia: Secondary | ICD-10-CM

## 2023-02-07 DIAGNOSIS — E039 Hypothyroidism, unspecified: Secondary | ICD-10-CM | POA: Diagnosis not present

## 2023-02-07 DIAGNOSIS — Z0001 Encounter for general adult medical examination with abnormal findings: Secondary | ICD-10-CM | POA: Diagnosis not present

## 2023-02-07 DIAGNOSIS — I10 Essential (primary) hypertension: Secondary | ICD-10-CM | POA: Diagnosis not present

## 2023-02-07 DIAGNOSIS — E782 Mixed hyperlipidemia: Secondary | ICD-10-CM | POA: Diagnosis not present

## 2023-02-08 LAB — LIPID PANEL: Cholesterol, Total: 173 mg/dL (ref 100–199)

## 2023-02-08 LAB — CMP14+EGFR: Total Protein: 6.7 g/dL (ref 6.0–8.5)

## 2023-02-13 ENCOUNTER — Encounter: Payer: Self-pay | Admitting: Nurse Practitioner

## 2023-02-13 ENCOUNTER — Ambulatory Visit (INDEPENDENT_AMBULATORY_CARE_PROVIDER_SITE_OTHER): Payer: Medicare HMO | Admitting: Nurse Practitioner

## 2023-02-13 VITALS — BP 130/65 | HR 62 | Temp 98.0°F | Resp 16 | Ht 60.0 in | Wt 123.6 lb

## 2023-02-13 DIAGNOSIS — M810 Age-related osteoporosis without current pathological fracture: Secondary | ICD-10-CM

## 2023-02-13 DIAGNOSIS — I10 Essential (primary) hypertension: Secondary | ICD-10-CM | POA: Diagnosis not present

## 2023-02-13 DIAGNOSIS — E039 Hypothyroidism, unspecified: Secondary | ICD-10-CM

## 2023-02-13 DIAGNOSIS — G8929 Other chronic pain: Secondary | ICD-10-CM | POA: Diagnosis not present

## 2023-02-13 DIAGNOSIS — M25511 Pain in right shoulder: Secondary | ICD-10-CM | POA: Diagnosis not present

## 2023-02-13 MED ORDER — MELOXICAM 7.5 MG PO TABS: 7.5000 mg | ORAL_TABLET | Freq: Every day | ORAL | 1 refills | Status: DC

## 2023-02-13 MED ORDER — IBANDRONATE SODIUM 150 MG PO TABS: ORAL_TABLET | ORAL | 3 refills | Status: DC

## 2023-02-13 NOTE — Progress Notes (Signed)
[facility] [address] [place], [place] [address]  Internal MEDICINE  Office Visit Note  Patient Name: Samantha Dennis  [id number]  [id number]  Date of Service: 02/13/2023  Chief Complaint  Patient presents with   Hypertension   Follow-up    HPI Samantha Dennis presents for a follow-up visit for right shoulder pain, labs, BP Right shoulder -- meloxicam helps, need refills. Labs discussed -- all of her labs are normal, discussed with patient.  Blood pressure well controlled with lisinopril-hydrochlorothiazide Hypothyroidism -- taking levothyroxine 75 mcg daily.     Current Medication: Outpatient Encounter Medications as of 02/13/2023  Medication Sig   BIOTIN PO Take by mouth daily.   Cholecalciferol (VITAMIN D) 125 MCG (5000 UT) CAPS Take 5,000 Units by mouth daily.   famciclovir (FAMVIR) 250 MG tablet TAKE 1 TABLET EVERY DAY   levothyroxine (SYNTHROID) 75 MCG tablet TAKE 1 TABLET EVERY DAY BEFORE BREAKFAST   lisinopril-hydrochlorothiazide (ZESTORETIC) 10-12.5 MG tablet TAKE 1 TABLET EVERY DAY   meloxicam (MOBIC) 7.5 MG tablet Take 1 tablet (7.5 mg total) by mouth daily.   Multiple Vitamins-Minerals (PRESERVISION AREDS 2) CAPS Take 2 capsules by mouth daily.   rosuvastatin (CRESTOR) 5 MG tablet TAKE 1 TABLET BY MOUTH DAILY.   [DISCONTINUED] ibandronate (BONIVA) 150 MG tablet TAKE 1 TABLET EVERY MONTH ON AN EMPTY STOMACH. DO NOT LIE DOWN FOR AT LEAST 1 HOUR AFTER TAKING DOSE   [DISCONTINUED] nitrofurantoin, macrocrystal-monohydrate, (MACROBID) 100 MG capsule Use as instructed (Patient taking differently: Take 100 mg by mouth 2 (two) times daily. For 10 days)   ibandronate (BONIVA) 150 MG tablet Take in the morning with a full glass of water, on an empty stomach, and do not take anything else by mouth or lie down for the next 30 min.   No facility-administered encounter medications on file as of 02/13/2023.    Surgical History: Past Surgical History:  Procedure Laterality Date    CATARACT EXTRACTION W/PHACO Right 07/09/2022   Procedure: CATARACT EXTRACTION PHACO AND INTRAOCULAR LENS PLACEMENT (IOC) RIGHT;  Surgeon: Nevada Crane, MD;  Location: [facility];  Service: Ophthalmology;  Laterality: Right;  3.77 0:35.1   CATARACT EXTRACTION W/PHACO Left 07/23/2022   Procedure: CATARACT EXTRACTION PHACO AND INTRAOCULAR LENS PLACEMENT (IOC) LEFT  4.70  00:37.0;  Surgeon: Nevada Crane, MD;  Location: [facility];  Service: Ophthalmology;  Laterality: Left;   LUMBAR LAMINECTOMY/DECOMPRESSION MICRODISCECTOMY Right 11/04/2019   Procedure: Laminectomy for facet/synovial cyst - Lumbar Four- Lumbar Five - right;  Surgeon: Tia Alert, MD;  Location: [facility] OR;  Service: Neurosurgery;  Laterality: Right;  Laminectomy for facet/synovial cyst - Lumbar Four- Lumbar Five - right   TOTAL ABDOMINAL HYSTERECTOMY Bilateral 1984    Medical History: Past Medical History:  Diagnosis Date   Arthritis    Right shoulder   Hay fever    Hypertension    Hypothyroidism    Osteoporosis    UTI (urinary tract infection)    last one 06/2019   Wears hearing aid in both ears     Family History: Family History  Problem Relation Age of Onset   Breast cancer Neg Hx     Social History   Socioeconomic History   Marital status: Married    Spouse name: Not on file   Number of children: Not on file   Years of education: Not on file   Highest education level: Not on file  Occupational History   Not on file  Tobacco Use  Smoking status: Never   Smokeless tobacco: Never  Vaping Use   Vaping status: Never Used  Substance and Sexual Activity   Alcohol use: Yes    Alcohol/week: 1.0 standard drink of alcohol    Types: 1 Glasses of wine per week    Comment: ocassionally    Drug use: No   Sexual activity: Not on file  Other Topics Concern   Not on file  Social History Narrative   Not on file   Social Determinants of Health   Financial Resource Strain: Not on file   Food Insecurity: Not on file  Transportation Needs: Not on file  Physical Activity: Not on file  Stress: Not on file  Social Connections: Not on file  Intimate Partner Violence: Not on file      Review of Systems  Constitutional:  Negative for chills, fatigue and unexpected weight change.  HENT:  Negative for congestion, rhinorrhea, sneezing and sore throat.   Eyes:  Negative for redness.  Respiratory:  Negative for cough, chest tightness and shortness of breath.   Cardiovascular:  Negative for chest pain and palpitations.  Gastrointestinal:  Negative for abdominal pain, constipation, diarrhea, nausea and vomiting.  Genitourinary:  Negative for dysuria and frequency.  Musculoskeletal:  Negative for arthralgias, back pain, joint swelling and neck pain.  Skin:  Negative for rash.  Neurological: Negative.  Negative for tremors and numbness.  Hematological:  Negative for adenopathy. Does not bruise/bleed easily.  Psychiatric/Behavioral:  Negative for behavioral problems (Depression), sleep disturbance and suicidal ideas. The patient is not nervous/anxious.     Vital Signs: BP 130/65 Comment: 150/72  Pulse 62   Temp 98 F (36.7 C)   Resp 16   Ht 5' (1.524 m)   Wt 123 lb 9.6 oz (56.1 kg)   SpO2 98%   BMI 24.14 kg/m    Physical Exam Vitals reviewed.  Constitutional:      General: She is not in acute distress.    Appearance: Normal appearance. She is not ill-appearing.  HENT:     Head: Normocephalic and atraumatic.  Cardiovascular:     Rate and Rhythm: Normal rate and regular rhythm.  Pulmonary:     Effort: Pulmonary effort is normal. No respiratory distress.  Skin:    General: Skin is warm and dry.     Capillary Refill: Capillary refill takes less than 2 seconds.  Neurological:     Mental Status: She is alert and oriented to person, place, and time.  Psychiatric:        Mood and Affect: Mood normal.        Behavior: Behavior normal.         Assessment/Plan: 1. Essential hypertension, benign Stable, continue lisinoprin-hydrochlorothiazide as prescribed.   2. Acquired hypothyroidism Continue levothyroxine as prescribed.   3. Age-related osteoporosis without current pathological fracture Continue monthly boniva as prescribed.  4. Chronic right shoulder pain Take meloxicam daily for pain and inflammation   General Counseling: Keaunna verbalizes understanding of the findings of todays visit and agrees with plan of treatment. I have discussed any further diagnostic evaluation that may be needed or ordered today. We also reviewed her medications today. she has been encouraged to call the office with any questions or concerns that should arise related to todays visit.    No orders of the defined types were placed in this encounter.   Meds ordered this encounter  Medications   meloxicam (MOBIC) 7.5 MG tablet    Sig: Take 1 tablet (  7.5 mg total) by mouth daily.    Dispense:  90 tablet    Refill:  1   ibandronate (BONIVA) 150 MG tablet    Sig: Take in the morning with a full glass of water, on an empty stomach, and do not take anything else by mouth or lie down for the next 30 min.    Dispense:  3 tablet    Refill:  3    Return for previously scheduled, AWV, Gaila Engebretsen PCP in february .   Total time spent:30 Minutes Time spent includes review of chart, medications, test results, and follow up plan with the patient.   [place] Controlled Substance Database was reviewed by me.  This patient was seen by Sallyanne Kuster, FNP-C in collaboration with Dr. Beverely Risen as a part of collaborative care agreement.   Janasia Coverdale R. Tedd Sias, MSN, FNP-C Internal medicine

## 2023-02-15 ENCOUNTER — Other Ambulatory Visit: Payer: Self-pay

## 2023-02-15 MED ORDER — IBANDRONATE SODIUM 150 MG PO TABS: ORAL_TABLET | ORAL | 3 refills | Status: DC

## 2023-03-10 ENCOUNTER — Encounter: Payer: Self-pay | Admitting: Nurse Practitioner

## 2023-05-10 ENCOUNTER — Other Ambulatory Visit: Payer: Self-pay | Admitting: Nurse Practitioner

## 2023-05-30 DIAGNOSIS — H43813 Vitreous degeneration, bilateral: Secondary | ICD-10-CM | POA: Diagnosis not present

## 2023-05-30 DIAGNOSIS — H353132 Nonexudative age-related macular degeneration, bilateral, intermediate dry stage: Secondary | ICD-10-CM | POA: Diagnosis not present

## 2023-05-30 DIAGNOSIS — Z961 Presence of intraocular lens: Secondary | ICD-10-CM | POA: Diagnosis not present

## 2023-08-07 ENCOUNTER — Ambulatory Visit (INDEPENDENT_AMBULATORY_CARE_PROVIDER_SITE_OTHER): Payer: Medicare HMO | Admitting: Nurse Practitioner

## 2023-08-07 ENCOUNTER — Encounter: Payer: Self-pay | Admitting: Nurse Practitioner

## 2023-08-07 VITALS — BP 134/80 | HR 61 | Temp 97.4°F | Resp 16 | Ht 60.0 in | Wt 124.4 lb

## 2023-08-07 DIAGNOSIS — Z1231 Encounter for screening mammogram for malignant neoplasm of breast: Secondary | ICD-10-CM | POA: Diagnosis not present

## 2023-08-07 DIAGNOSIS — Z0000 Encounter for general adult medical examination without abnormal findings: Secondary | ICD-10-CM | POA: Diagnosis not present

## 2023-08-07 DIAGNOSIS — G8929 Other chronic pain: Secondary | ICD-10-CM

## 2023-08-07 DIAGNOSIS — E782 Mixed hyperlipidemia: Secondary | ICD-10-CM | POA: Diagnosis not present

## 2023-08-07 DIAGNOSIS — E039 Hypothyroidism, unspecified: Secondary | ICD-10-CM | POA: Diagnosis not present

## 2023-08-07 DIAGNOSIS — M810 Age-related osteoporosis without current pathological fracture: Secondary | ICD-10-CM

## 2023-08-07 DIAGNOSIS — I10 Essential (primary) hypertension: Secondary | ICD-10-CM | POA: Diagnosis not present

## 2023-08-07 DIAGNOSIS — M25511 Pain in right shoulder: Secondary | ICD-10-CM

## 2023-08-07 MED ORDER — MELOXICAM 15 MG PO TABS: 15.0000 mg | ORAL_TABLET | Freq: Every day | ORAL | 5 refills | Status: DC

## 2023-08-07 NOTE — Progress Notes (Signed)
 [facility] [address] [place], [place] [address]  Internal MEDICINE  Office Visit Note  Patient Name: Samantha Dennis  [id number]  [id number]  Date of Service: 08/07/2023  Chief Complaint  Patient presents with   Hypertension   Medicare Wellness    HPI Samantha Dennis presents for a medicare annual wellness visit.  Well-appearing 89 y.o. female with high cholesterol, hypertension, hypothyroidism, and osteoporosis and right shoulder arthritis, bilateral cataract surgery in January last year and she wears hearing aids.  Routine mammogram: due this year, last done in July 2023 Dexa scan done in July 2023 CRC screening discontinued.  Labs: due for routine labs  New or worsening pain: arthritis pains, right shoulder Other concerns: none     08/07/2023   10:14 AM 08/01/2022   11:04 AM 07/28/2021   10:07 AM  MMSE - Mini Mental State Exam  Orientation to time 5 5 5   Orientation to Place 5 5 5   Registration 3 3 3   Attention/ Calculation 5 5 5   Recall 3 3 3   Language- name 2 objects 2 2 2   Language- repeat 1 1 1   Language- follow 3 step command 3 3 3   Language- read & follow direction 1 1 1   Write a sentence 1 1 1   Copy design 1 1 1   Total score 30 30 30     Functional Status Survey: Is the patient deaf or have difficulty hearing?: No Does the patient have difficulty seeing, even when wearing glasses/contacts?: No Does the patient have difficulty concentrating, remembering, or making decisions?: No Does the patient have difficulty walking or climbing stairs?: No Does the patient have difficulty dressing or bathing?: No Does the patient have difficulty doing errands alone such as visiting a doctor's office or shopping?: No     01/24/2021   11:53 AM 07/28/2021   10:04 AM 01/29/2022   10:48 AM 08/01/2022   11:03 AM 08/07/2023   10:13 AM  Fall Risk  Falls in the past year? 0 0 0 0 0  Was there an injury with Fall?    0 0  Fall Risk Category Calculator    0 0  (RETIRED) Patient  Fall Risk Level  Low fall risk     Patient at Risk for Falls Due to No Fall Risks No Fall Risks  No Fall Risks No Fall Risks  Fall risk Follow up Falls evaluation completed Falls evaluation completed  Falls evaluation completed Falls evaluation completed       08/07/2023   10:13 AM  Depression screen PHQ 2/9  Decreased Interest 0  Down, Depressed, Hopeless 0  PHQ - 2 Score 0        Current Medication: Outpatient Encounter Medications as of 08/07/2023  Medication Sig   BIOTIN PO Take by mouth daily.   Cholecalciferol (VITAMIN D) 125 MCG (5000 UT) CAPS Take 5,000 Units by mouth daily.   ibandronate (BONIVA) 150 MG tablet Take 1 tab po once a month in the morning with a full glass of water, on an empty stomach, and do not take anything else by mouth or lie down for the next 30 min.   levothyroxine (SYNTHROID) 75 MCG tablet TAKE 1 TABLET EVERY DAY BEFORE BREAKFAST   lisinopril-hydrochlorothiazide (ZESTORETIC) 10-12.5 MG tablet TAKE 1 TABLET EVERY DAY   Multiple Vitamins-Minerals (PRESERVISION AREDS 2) CAPS Take 2 capsules by mouth daily.   rosuvastatin (CRESTOR) 5 MG tablet TAKE 1 TABLET BY MOUTH DAILY.   [DISCONTINUED] famciclovir (FAMVIR) 250 MG tablet TAKE 1  TABLET EVERY DAY   [DISCONTINUED] meloxicam (MOBIC) 15 MG tablet Take 1 tablet (15 mg total) by mouth daily.   [DISCONTINUED] meloxicam (MOBIC) 7.5 MG tablet TAKE 1 TABLET EVERY DAY   No facility-administered encounter medications on file as of 08/07/2023.    Surgical History: Past Surgical History:  Procedure Laterality Date   CATARACT EXTRACTION W/PHACO Right 07/09/2022   Procedure: CATARACT EXTRACTION PHACO AND INTRAOCULAR LENS PLACEMENT (IOC) RIGHT;  Surgeon: Nevada Crane, MD;  Location: [facility];  Service: Ophthalmology;  Laterality: Right;  3.77 0:35.1   CATARACT EXTRACTION W/PHACO Left 07/23/2022   Procedure: CATARACT EXTRACTION PHACO AND INTRAOCULAR LENS PLACEMENT (IOC) LEFT  4.70  00:37.0;  Surgeon:  Nevada Crane, MD;  Location: [facility];  Service: Ophthalmology;  Laterality: Left;   LUMBAR LAMINECTOMY/DECOMPRESSION MICRODISCECTOMY Right 11/04/2019   Procedure: Laminectomy for facet/synovial cyst - Lumbar Four- Lumbar Five - right;  Surgeon: Tia Alert, MD;  Location: [facility] OR;  Service: Neurosurgery;  Laterality: Right;  Laminectomy for facet/synovial cyst - Lumbar Four- Lumbar Five - right   TOTAL ABDOMINAL HYSTERECTOMY Bilateral 1984    Medical History: Past Medical History:  Diagnosis Date   Arthritis    Right shoulder   Hay fever    Hypertension    Hypothyroidism    Osteoporosis    UTI (urinary tract infection)    last one 06/2019   Wears hearing aid in both ears     Family History: Family History  Problem Relation Age of Onset   Breast cancer Neg Hx     Social History   Socioeconomic History   Marital status: Married    Spouse name: Not on file   Number of children: Not on file   Years of education: Not on file   Highest education level: Not on file  Occupational History   Not on file  Tobacco Use   Smoking status: Never   Smokeless tobacco: Never  Vaping Use   Vaping status: Never Used  Substance and Sexual Activity   Alcohol use: Yes    Alcohol/week: 1.0 standard drink of alcohol    Types: 1 Glasses of wine per week    Comment: ocassionally    Drug use: No   Sexual activity: Not on file  Other Topics Concern   Not on file  Social History Narrative   Not on file   Social Drivers of Health   Financial Resource Strain: Not on file  Food Insecurity: Not on file  Transportation Needs: Not on file  Physical Activity: Not on file  Stress: Not on file  Social Connections: Not on file  Intimate Partner Violence: Not on file      Review of Systems  Constitutional:  Negative for activity change, appetite change, chills, fatigue, fever and unexpected weight change.  HENT: Negative.  Negative for congestion, ear pain, rhinorrhea, sore  throat and trouble swallowing.   Eyes: Negative.   Respiratory: Negative.  Negative for cough, chest tightness, shortness of breath and wheezing.   Cardiovascular: Negative.  Negative for chest pain.  Gastrointestinal: Negative.  Negative for abdominal pain, blood in stool, constipation, diarrhea, nausea and vomiting.  Endocrine: Negative.   Genitourinary: Negative.  Negative for difficulty urinating, dysuria, frequency, hematuria and urgency.  Musculoskeletal:  Positive for arthralgias (right shoulder). Negative for back pain, joint swelling, myalgias and neck pain.  Skin: Negative.  Negative for rash and wound.  Allergic/Immunologic: Negative.  Negative for immunocompromised state.  Neurological: Negative.  Negative for dizziness, seizures, numbness and headaches.  Hematological: Negative.   Psychiatric/Behavioral: Negative.  Negative for behavioral problems, self-injury and suicidal ideas. The patient is not nervous/anxious.     Vital Signs: BP 134/80 Comment: 148/88  Pulse 61   Temp (!) 97.4 F (36.3 C)   Resp 16   Ht 5' (1.524 m)   Wt 124 lb 6.4 oz (56.4 kg)   SpO2 99%   BMI 24.30 kg/m    Physical Exam Vitals reviewed.  Constitutional:      General: She is awake. She is not in acute distress.    Appearance: Normal appearance. She is well-developed, well-groomed and normal weight. She is not ill-appearing or diaphoretic.  HENT:     Head: Normocephalic and atraumatic.     Mouth/Throat:     Lips: Pink.     Pharynx: Uvula midline.  Eyes:     General: Lids are normal. Vision grossly intact. Gaze aligned appropriately.     Extraocular Movements: Extraocular movements intact.     Conjunctiva/sclera: Conjunctivae normal.     Pupils: Pupils are equal, round, and reactive to light.     Funduscopic exam:    Right eye: Red reflex present.        Left eye: Red reflex present. Neck:     Thyroid: No thyromegaly.     Vascular: No JVD.     Trachea: Trachea and phonation normal.  No tracheal deviation.  Cardiovascular:     Rate and Rhythm: Normal rate and regular rhythm.     Pulses: Normal pulses.     Heart sounds: Normal heart sounds, S1 normal and S2 normal. No murmur heard.    No friction rub. No gallop.  Pulmonary:     Effort: Pulmonary effort is normal. No accessory muscle usage or respiratory distress.     Breath sounds: Normal breath sounds and air entry. No stridor. No wheezing or rales.  Chest:     Chest wall: No tenderness.     Comments: Declined clinical breast exam. Abdominal:     Palpations: There is no shifting dullness, fluid wave or pulsatile mass.  Skin:    General: Skin is warm and dry.     Capillary Refill: Capillary refill takes less than 2 seconds.  Neurological:     Mental Status: She is alert and oriented to person, place, and time.     Cranial Nerves: No cranial nerve deficit.     Motor: No abnormal muscle tone.     Coordination: Coordination normal.     Gait: Gait normal.     Deep Tendon Reflexes: Reflexes are normal and symmetric.  Psychiatric:        Mood and Affect: Mood normal.        Behavior: Behavior normal. Behavior is cooperative.        Thought Content: Thought content normal.        Judgment: Judgment normal.        Assessment/Plan: 1. Encounter for subsequent annual wellness visit (AWV) in Medicare patient (Primary) Age-appropriate preventive screenings and vaccinations discussed, annual physical exam completed. Routine labs for health maintenance ordered (if needed). PHM updated.    2. Essential hypertension, benign Stable, continue lisinopril-hydrochlorothiazide as prescribed.  - CBC with Differential/Platelet - CMP14+EGFR - Lipid Profile  3. Mixed hyperlipidemia Routine labs ordered. Continue rosuvastatin as prescribed.  - CBC with Differential/Platelet - CMP14+EGFR - Lipid Profile  4. Acquired hypothyroidism Continue levothyroxine as prescribed. Repeat thyroid labs  - TSH + free T4  5. Age-related  osteoporosis without current pathological fracture Continue boniva as prescribed   6. Chronic right shoulder pain Meloxicam prescribed for arthritic pain   7. Encounter for screening mammogram for malignant neoplasm of breast Routine mammogram ordered  - MM 3D SCREENING MAMMOGRAM BILATERAL BREAST; Future     General Counseling: Samantha Dennis verbalizes understanding of the findings of todays visit and agrees with plan of treatment. I have discussed any further diagnostic evaluation that may be needed or ordered today. We also reviewed her medications today. she has been encouraged to call the office with any questions or concerns that should arise related to todays visit.    Orders Placed This Encounter  Procedures   MM 3D SCREENING MAMMOGRAM BILATERAL BREAST   CBC with Differential/Platelet   CMP14+EGFR   Lipid Profile   TSH + free T4    Meds ordered this encounter  Medications   DISCONTD: meloxicam (MOBIC) 15 MG tablet    Sig: Take 1 tablet (15 mg total) by mouth daily.    Dispense:  30 tablet    Refill:  5    Fill new script today    Return in about 6 months (around 02/04/2024) for F/U, Labs, Samantha Dennis.   Total time spent:30 Minutes Time spent includes review of chart, medications, test results, and follow up plan with the patient.   [place] Controlled Substance Database was reviewed by me.  This patient was seen by Sallyanne Kuster, FNP-C in collaboration with Dr. Beverely Risen as a part of collaborative care agreement.  Chanika Byland R. Tedd Sias, MSN, FNP-C Internal medicine

## 2023-08-11 ENCOUNTER — Other Ambulatory Visit: Payer: Self-pay | Admitting: Internal Medicine

## 2023-08-28 ENCOUNTER — Ambulatory Visit (INDEPENDENT_AMBULATORY_CARE_PROVIDER_SITE_OTHER): Admitting: Nurse Practitioner

## 2023-08-28 ENCOUNTER — Encounter: Payer: Self-pay | Admitting: Nurse Practitioner

## 2023-08-28 VITALS — BP 138/65 | HR 73 | Temp 98.3°F | Resp 16 | Ht 60.0 in | Wt 125.4 lb

## 2023-08-28 DIAGNOSIS — I10 Essential (primary) hypertension: Secondary | ICD-10-CM | POA: Diagnosis not present

## 2023-08-28 DIAGNOSIS — G8929 Other chronic pain: Secondary | ICD-10-CM

## 2023-08-28 DIAGNOSIS — M25511 Pain in right shoulder: Secondary | ICD-10-CM

## 2023-08-28 MED ORDER — MELOXICAM 15 MG PO TABS: 15.0000 mg | ORAL_TABLET | Freq: Every day | ORAL | 3 refills | Status: DC

## 2023-08-28 NOTE — Progress Notes (Signed)
 [facility] [address] [place], [place] [address]  Internal MEDICINE  Office Visit Note  Patient Name: Samantha Dennis  [id number]  [id number]  Date of Service: 08/28/2023  Chief Complaint  Patient presents with   Acute Visit     HPI Cathe presents for an acute sick visit for elevated blood pressure BP elevated on home machine Increased stress, has things to do, power went out at home.  Asymptomatic, no headache or other symptoms.  Home BP monitor may need new batteries.  Chronic arthritis -- takes meloxicam sometimes.     Current Medication:  Outpatient Encounter Medications as of 08/28/2023  Medication Sig   BIOTIN PO Take by mouth daily.   Cholecalciferol (VITAMIN D) 125 MCG (5000 UT) CAPS Take 5,000 Units by mouth daily.   famciclovir (FAMVIR) 250 MG tablet TAKE 1 TABLET EVERY DAY   ibandronate (BONIVA) 150 MG tablet Take 1 tab po once a month in the morning with a full glass of water, on an empty stomach, and do not take anything else by mouth or lie down for the next 30 min.   levothyroxine (SYNTHROID) 75 MCG tablet TAKE 1 TABLET EVERY DAY BEFORE BREAKFAST   lisinopril-hydrochlorothiazide (ZESTORETIC) 10-12.5 MG tablet TAKE 1 TABLET EVERY DAY   Multiple Vitamins-Minerals (PRESERVISION AREDS 2) CAPS Take 2 capsules by mouth daily.   rosuvastatin (CRESTOR) 5 MG tablet TAKE 1 TABLET BY MOUTH DAILY.   [DISCONTINUED] meloxicam (MOBIC) 15 MG tablet Take 1 tablet (15 mg total) by mouth daily.   meloxicam (MOBIC) 15 MG tablet Take 1 tablet (15 mg total) by mouth daily.   No facility-administered encounter medications on file as of 08/28/2023.      Medical History: Past Medical History:  Diagnosis Date   Arthritis    Right shoulder   Hay fever    Hypertension    Hypothyroidism    Osteoporosis    UTI (urinary tract infection)    last one 06/2019   Wears hearing aid in both ears      Vital Signs: BP 138/65 Comment: 150/90  Pulse 73   Temp 98.3 F (36.8  C)   Resp 16   Ht 5' (1.524 m)   Wt 125 lb 6.4 oz (56.9 kg)   SpO2 96%   BMI 24.49 kg/m    Review of Systems  Constitutional: Negative.  Negative for fatigue.  Respiratory: Negative.  Negative for cough, chest tightness, shortness of breath and wheezing.   Cardiovascular: Negative.  Negative for chest pain and palpitations.  Gastrointestinal: Negative.   Musculoskeletal:  Positive for arthralgias.  Neurological: Negative.     Physical Exam Vitals reviewed.  Constitutional:      General: She is not in acute distress.    Appearance: Normal appearance. She is normal weight. She is not ill-appearing.  HENT:     Head: Normocephalic and atraumatic.  Eyes:     Pupils: Pupils are equal, round, and reactive to light.  Cardiovascular:     Rate and Rhythm: Normal rate and regular rhythm.  Pulmonary:     Effort: Pulmonary effort is normal. No respiratory distress.  Neurological:     Mental Status: She is alert and oriented to person, place, and time.  Psychiatric:        Mood and Affect: Mood normal.        Behavior: Behavior normal.       Assessment/Plan: 1. Essential hypertension, benign (Primary) Blood pressure is stable, trouble shoot with machine, replace batteries  or replace the machine if needed.   2. Chronic right shoulder pain Continue meloxicam as prescribed.  - meloxicam (MOBIC) 15 MG tablet; Take 1 tablet (15 mg total) by mouth daily.  Dispense: 90 tablet; Refill: 3   General Counseling: Samantha Dennis verbalizes understanding of the findings of todays visit and agrees with plan of treatment. I have discussed any further diagnostic evaluation that may be needed or ordered today. We also reviewed her medications today. she has been encouraged to call the office with any questions or concerns that should arise related to todays visit.    Counseling:    No orders of the defined types were placed in this encounter.   Meds ordered this encounter  Medications    meloxicam (MOBIC) 15 MG tablet    Sig: Take 1 tablet (15 mg total) by mouth daily.    Dispense:  90 tablet    Refill:  3    Fill new script today    Return if symptoms worsen or fail to improve.  [place] Controlled Substance Database was reviewed by me for overdose risk score (ORS)  Time spent:20 Minutes Time spent with patient included reviewing progress notes, labs, imaging studies, and discussing plan for follow up.   This patient was seen by Laurence Pons, FNP-C in collaboration with Dr. Verneta Gone as a part of collaborative care agreement.  Dakari Stabler R. Bobbi Burow, MSN, FNP-C Internal Medicine

## 2023-09-14 ENCOUNTER — Encounter: Payer: Self-pay | Admitting: Nurse Practitioner

## 2023-10-05 ENCOUNTER — Encounter: Payer: Self-pay | Admitting: Nurse Practitioner

## 2023-10-25 DIAGNOSIS — M25511 Pain in right shoulder: Secondary | ICD-10-CM | POA: Diagnosis not present

## 2023-11-20 ENCOUNTER — Other Ambulatory Visit: Payer: Self-pay | Admitting: Nurse Practitioner

## 2023-11-20 DIAGNOSIS — I10 Essential (primary) hypertension: Secondary | ICD-10-CM

## 2023-11-20 DIAGNOSIS — E782 Mixed hyperlipidemia: Secondary | ICD-10-CM

## 2023-11-26 DIAGNOSIS — L309 Dermatitis, unspecified: Secondary | ICD-10-CM | POA: Diagnosis not present

## 2024-01-30 DIAGNOSIS — E782 Mixed hyperlipidemia: Secondary | ICD-10-CM | POA: Diagnosis not present

## 2024-01-30 DIAGNOSIS — E039 Hypothyroidism, unspecified: Secondary | ICD-10-CM | POA: Diagnosis not present

## 2024-01-30 DIAGNOSIS — I10 Essential (primary) hypertension: Secondary | ICD-10-CM | POA: Diagnosis not present

## 2024-01-31 LAB — CBC WITH DIFFERENTIAL/PLATELET
Basophils Absolute: 0.1 x10E3/uL (ref 0.0–0.2)
Basos: 1 %
EOS (ABSOLUTE): 0.2 x10E3/uL (ref 0.0–0.4)
Eos: 2 %
Hematocrit: 46.3 % (ref 34.0–46.6)
Hemoglobin: 15.3 g/dL (ref 11.1–15.9)
Immature Grans (Abs): 0 x10E3/uL (ref 0.0–0.1)
Immature Granulocytes: 0 %
Lymphocytes Absolute: 2.5 x10E3/uL (ref 0.7–3.1)
Lymphs: 38 %
MCH: 31.4 pg (ref 26.6–33.0)
MCHC: 33 g/dL (ref 31.5–35.7)
MCV: 95 fL (ref 79–97)
Monocytes Absolute: 0.7 x10E3/uL (ref 0.1–0.9)
Monocytes: 11 %
Neutrophils Absolute: 3.1 x10E3/uL (ref 1.4–7.0)
Neutrophils: 48 %
Platelets: 194 x10E3/uL (ref 150–450)
RBC: 4.88 x10E6/uL (ref 3.77–5.28)
RDW: 13.1 % (ref 11.7–15.4)
WBC: 6.6 x10E3/uL (ref 3.4–10.8)

## 2024-01-31 LAB — CMP14+EGFR
ALT: 23 IU/L (ref 0–32)
AST: 28 IU/L (ref 0–40)
Albumin: 4.5 g/dL (ref 3.7–4.7)
Alkaline Phosphatase: 55 IU/L (ref 44–121)
BUN/Creatinine Ratio: 31 — ABNORMAL HIGH (ref 12–28)
BUN: 19 mg/dL (ref 8–27)
Bilirubin Total: 0.6 mg/dL (ref 0.0–1.2)
CO2: 23 mmol/L (ref 20–29)
Calcium: 9.8 mg/dL (ref 8.7–10.3)
Chloride: 98 mmol/L (ref 96–106)
Creatinine, Ser: 0.62 mg/dL (ref 0.57–1.00)
Globulin, Total: 2.1 g/dL (ref 1.5–4.5)
Glucose: 91 mg/dL (ref 70–99)
Potassium: 5.2 mmol/L (ref 3.5–5.2)
Sodium: 138 mmol/L (ref 134–144)
Total Protein: 6.6 g/dL (ref 6.0–8.5)
eGFR: 86 mL/min/1.73 (ref >59)

## 2024-01-31 LAB — TSH+FREE T4
Free T4: 1.58 ng/dL (ref 0.82–1.77)
TSH: 2.02 u[IU]/mL (ref 0.450–4.500)

## 2024-01-31 LAB — LIPID PANEL
Chol/HDL Ratio: 2.2 ratio (ref 0.0–4.4)
Cholesterol, Total: 175 mg/dL (ref 100–199)
HDL: 81 mg/dL (ref >39)
LDL Chol Calc (NIH): 79 mg/dL (ref 0–99)
Triglycerides: 80 mg/dL (ref 0–149)
VLDL Cholesterol Cal: 15 mg/dL (ref 5–40)

## 2024-02-05 ENCOUNTER — Ambulatory Visit: Payer: Medicare HMO | Admitting: Nurse Practitioner

## 2024-02-05 ENCOUNTER — Encounter: Payer: Self-pay | Admitting: Nurse Practitioner

## 2024-02-05 VITALS — BP 130/70 | HR 63 | Temp 97.7°F | Resp 16 | Ht 60.0 in | Wt 122.0 lb

## 2024-02-05 DIAGNOSIS — G8929 Other chronic pain: Secondary | ICD-10-CM | POA: Insufficient documentation

## 2024-02-05 DIAGNOSIS — M25511 Pain in right shoulder: Secondary | ICD-10-CM | POA: Diagnosis not present

## 2024-02-05 DIAGNOSIS — I10 Essential (primary) hypertension: Secondary | ICD-10-CM

## 2024-02-05 DIAGNOSIS — M810 Age-related osteoporosis without current pathological fracture: Secondary | ICD-10-CM

## 2024-02-05 DIAGNOSIS — E039 Hypothyroidism, unspecified: Secondary | ICD-10-CM

## 2024-02-05 MED ORDER — DICLOFENAC SODIUM 75 MG PO TBEC: 75.0000 mg | DELAYED_RELEASE_TABLET | Freq: Two times a day (BID) | ORAL | 3 refills | Status: AC

## 2024-02-05 NOTE — Progress Notes (Signed)
 [facility] [address] [place], [place] [address]  Internal MEDICINE  Office Visit Note  Patient Name: Samantha Dennis  [id number]  [id number]  Date of Service: 02/05/2024  Chief Complaint  Patient presents with   Hypertension   Follow-up    HPI Samantha Dennis presents for a follow-up visit for chronic right shoulder pain, osteoporosis, hypertension and hypothyroidism.  Chronic right shoulder pain -- meloxicam  is not helping, she wants to try something else. She also plans to call her orthopedic doctor to schedule an ultrasound-guided cortisone injection.  Osteoporosis -- patient has been taking boniva  for 5-6 years. It is time to stop the medication since the recommended course of treatment is 5 years.  Hypertension -- BP improved when rechecked. Controlled with current medication Hypothyroidism -- thyroid  labs are normal, taking levothyroxine  daily.     Current Medication: Outpatient Encounter Medications as of 02/05/2024  Medication Sig   diclofenac  (VOLTAREN ) 75 MG EC tablet Take 1 tablet (75 mg total) by mouth 2 (two) times daily.   BIOTIN PO Take by mouth daily.   Cholecalciferol (VITAMIN D ) 125 MCG (5000 UT) CAPS Take 5,000 Units by mouth daily.   famciclovir  (FAMVIR ) 250 MG tablet TAKE 1 TABLET EVERY DAY   levothyroxine  (SYNTHROID ) 75 MCG tablet TAKE 1 TABLET EVERY DAY BEFORE BREAKFAST   lisinopril -hydrochlorothiazide  (ZESTORETIC ) 10-12.5 MG tablet TAKE 1 TABLET EVERY DAY   Multiple Vitamins-Minerals (PRESERVISION AREDS 2) CAPS Take 2 capsules by mouth daily.   rosuvastatin  (CRESTOR ) 5 MG tablet TAKE 1 TABLET EVERY DAY   [DISCONTINUED] ibandronate  (BONIVA ) 150 MG tablet Take 1 tab po once a month in the morning with a full glass of water, on an empty stomach, and do not take anything else by mouth or lie down for the next 30 min.   [DISCONTINUED] meloxicam  (MOBIC ) 15 MG tablet Take 1 tablet (15 mg total) by mouth daily.   No facility-administered encounter medications  on file as of 02/05/2024.    Surgical History: Past Surgical History:  Procedure Laterality Date   CATARACT EXTRACTION W/PHACO Right 07/09/2022   Procedure: CATARACT EXTRACTION PHACO AND INTRAOCULAR LENS PLACEMENT (IOC) RIGHT;  Surgeon: Myrna Adine Anes, MD;  Location: [facility];  Service: Ophthalmology;  Laterality: Right;  3.77 0:35.1   CATARACT EXTRACTION W/PHACO Left 07/23/2022   Procedure: CATARACT EXTRACTION PHACO AND INTRAOCULAR LENS PLACEMENT (IOC) LEFT  4.70  00:37.0;  Surgeon: Myrna Adine Anes, MD;  Location: [facility];  Service: Ophthalmology;  Laterality: Left;   LUMBAR LAMINECTOMY/DECOMPRESSION MICRODISCECTOMY Right 11/04/2019   Procedure: Laminectomy for facet/synovial cyst - Lumbar Four- Lumbar Five - right;  Surgeon: Joshua Alm RAMAN, MD;  Location: [facility] OR;  Service: Neurosurgery;  Laterality: Right;  Laminectomy for facet/synovial cyst - Lumbar Four- Lumbar Five - right   TOTAL ABDOMINAL HYSTERECTOMY Bilateral 1984    Medical History: Past Medical History:  Diagnosis Date   Arthritis    Right shoulder   Hay fever    Hypertension    Hypothyroidism    Osteoporosis    UTI (urinary tract infection)    last one 06/2019   Wears hearing aid in both ears     Family History: Family History  Problem Relation Age of Onset   Breast cancer Neg Hx     Social History   Socioeconomic History   Marital status: Married    Spouse name: Not on file   Number of children: Not on file   Years of education: Not on file  Highest education level: Not on file  Occupational History   Not on file  Tobacco Use   Smoking status: Never   Smokeless tobacco: Never  Vaping Use   Vaping status: Never Used  Substance and Sexual Activity   Alcohol use: Yes    Alcohol/week: 1.0 standard drink of alcohol    Types: 1 Glasses of wine per week    Comment: ocassionally    Drug use: No   Sexual activity: Not on file  Other Topics Concern   Not on file  Social History  Narrative   Not on file   Social Drivers of Health   Financial Resource Strain: Not on file  Food Insecurity: Not on file  Transportation Needs: Not on file  Physical Activity: Not on file  Stress: Not on file  Social Connections: Not on file  Intimate Partner Violence: Not on file      Review of Systems  Constitutional: Negative.  Negative for fatigue.  Respiratory: Negative.  Negative for cough, chest tightness, shortness of breath and wheezing.   Cardiovascular: Negative.  Negative for chest pain and palpitations.  Gastrointestinal: Negative.   Musculoskeletal:  Positive for arthralgias.  Neurological: Negative.     Vital Signs: BP 130/70 Comment: 150/88  Pulse 63   Temp 97.7 F (36.5 C)   Resp 16   Ht 5' (1.524 m)   Wt 122 lb (55.3 kg)   SpO2 96%   BMI 23.83 kg/m    Physical Exam Vitals reviewed.  Constitutional:      General: She is not in acute distress.    Appearance: Normal appearance. She is normal weight. She is not ill-appearing.  HENT:     Head: Normocephalic and atraumatic.  Eyes:     Pupils: Pupils are equal, round, and reactive to light.  Cardiovascular:     Rate and Rhythm: Normal rate and regular rhythm.  Pulmonary:     Effort: Pulmonary effort is normal. No respiratory distress.  Neurological:     Mental Status: She is alert and oriented to person, place, and time.  Psychiatric:        Mood and Affect: Mood normal.        Behavior: Behavior normal.        Assessment/Plan: 1. Essential hypertension, benign (Primary) Stable, continue lisinopril -hydrochlorothiazide  as prescribed.   2. Acquired hypothyroidism Labs are stable, continue levothyroxine  as prescribed.   3. Chronic right shoulder pain Discontinue meloxicam , start diclofenac  as prescribed.  - diclofenac  (VOLTAREN ) 75 MG EC tablet; Take 1 tablet (75 mg total) by mouth 2 (two) times daily.  Dispense: 60 tablet; Refill: 3  4. Age-related osteoporosis without current  pathological fracture Stop boniva , treatment course is completed.    General Counseling: Samantha Dennis understanding of the findings of todays visit and agrees with plan of treatment. I have discussed any further diagnostic evaluation that may be needed or ordered today. We also reviewed her medications today. she has been encouraged to call the office with any questions or concerns that should arise related to todays visit.    No orders of the defined types were placed in this encounter.   Meds ordered this encounter  Medications   diclofenac  (VOLTAREN ) 75 MG EC tablet    Sig: Take 1 tablet (75 mg total) by mouth 2 (two) times daily.    Dispense:  60 tablet    Refill:  3    Discontinue meloxicam  and fill new script today.    Return for previously  scheduled, AWV, Samantha Dennis PCP in february. .   Total time spent:30 Minutes Time spent includes review of chart, medications, test results, and follow up plan with the patient.   [place] Controlled Substance Database was reviewed by me.  This patient was seen by Mardy Maxin, FNP-C in collaboration with Dr. Sigrid Bathe as a part of collaborative care agreement.   Vinessa Macconnell R. Maxin, MSN, FNP-C Internal medicine

## 2024-02-05 NOTE — Progress Notes (Unsigned)
 [facility] [address] [place], [place] [address]  Internal MEDICINE  Office Visit Note  Patient Name: Samantha Dennis  [id number]  [id number]  Date of Service: 02/05/2024  Chief Complaint  Patient presents with   Hypertension   Follow-up    HPI Samantha Dennis presents for a follow-up visit for chronic right shoulder pain, osteoporosis, hypertension and hypothyroidism.  Chronic right shoulder pain -- meloxicam  is not helping, she wants to try something else. She also plans to call her orthopedic doctor to schedule an ultrasound-guided cortisone injection.  Osteoporosis -- patient has been taking boniva  for 5-6 years. It is time to stop the medication since the recommended course of treatment is 5 years.  Hypertension -- BP improved when rechecked. Controlled with current medication Hypothyroidism -- thyroid  labs are normal, taking levothyroxine  daily.     Current Medication: Outpatient Encounter Medications as of 02/05/2024  Medication Sig   diclofenac  (VOLTAREN ) 75 MG EC tablet Take 1 tablet (75 mg total) by mouth 2 (two) times daily.   BIOTIN PO Take by mouth daily.   Cholecalciferol (VITAMIN D ) 125 MCG (5000 UT) CAPS Take 5,000 Units by mouth daily.   famciclovir  (FAMVIR ) 250 MG tablet TAKE 1 TABLET EVERY DAY   levothyroxine  (SYNTHROID ) 75 MCG tablet TAKE 1 TABLET EVERY DAY BEFORE BREAKFAST   lisinopril -hydrochlorothiazide  (ZESTORETIC ) 10-12.5 MG tablet TAKE 1 TABLET EVERY DAY   Multiple Vitamins-Minerals (PRESERVISION AREDS 2) CAPS Take 2 capsules by mouth daily.   rosuvastatin  (CRESTOR ) 5 MG tablet TAKE 1 TABLET EVERY DAY   [DISCONTINUED] ibandronate  (BONIVA ) 150 MG tablet Take 1 tab po once a month in the morning with a full glass of water, on an empty stomach, and do not take anything else by mouth or lie down for the next 30 min.   [DISCONTINUED] meloxicam  (MOBIC ) 15 MG tablet Take 1 tablet (15 mg total) by mouth daily.   No facility-administered encounter medications  on file as of 02/05/2024.    Surgical History: Past Surgical History:  Procedure Laterality Date   CATARACT EXTRACTION W/PHACO Right 07/09/2022   Procedure: CATARACT EXTRACTION PHACO AND INTRAOCULAR LENS PLACEMENT (IOC) RIGHT;  Surgeon: Myrna Adine Anes, MD;  Location: [facility];  Service: Ophthalmology;  Laterality: Right;  3.77 0:35.1   CATARACT EXTRACTION W/PHACO Left 07/23/2022   Procedure: CATARACT EXTRACTION PHACO AND INTRAOCULAR LENS PLACEMENT (IOC) LEFT  4.70  00:37.0;  Surgeon: Myrna Adine Anes, MD;  Location: [facility];  Service: Ophthalmology;  Laterality: Left;   LUMBAR LAMINECTOMY/DECOMPRESSION MICRODISCECTOMY Right 11/04/2019   Procedure: Laminectomy for facet/synovial cyst - Lumbar Four- Lumbar Five - right;  Surgeon: Joshua Alm RAMAN, MD;  Location: [facility] OR;  Service: Neurosurgery;  Laterality: Right;  Laminectomy for facet/synovial cyst - Lumbar Four- Lumbar Five - right   TOTAL ABDOMINAL HYSTERECTOMY Bilateral 1984    Medical History: Past Medical History:  Diagnosis Date   Arthritis    Right shoulder   Hay fever    Hypertension    Hypothyroidism    Osteoporosis    UTI (urinary tract infection)    last one 06/2019   Wears hearing aid in both ears     Family History: Family History  Problem Relation Age of Onset   Breast cancer Neg Hx     Social History   Socioeconomic History   Marital status: Married    Spouse name: Not on file   Number of children: Not on file   Years of education: Not on file  Highest education level: Not on file  Occupational History   Not on file  Tobacco Use   Smoking status: Never   Smokeless tobacco: Never  Vaping Use   Vaping status: Never Used  Substance and Sexual Activity   Alcohol use: Yes    Alcohol/week: 1.0 standard drink of alcohol    Types: 1 Glasses of wine per week    Comment: ocassionally    Drug use: No   Sexual activity: Not on file  Other Topics Concern   Not on file  Social History  Narrative   Not on file   Social Drivers of Health   Financial Resource Dennis: Not on file  Food Insecurity: Not on file  Transportation Needs: Not on file  Physical Activity: Not on file  Stress: Not on file  Social Connections: Not on file  Intimate Partner Violence: Not on file      Review of Systems  Constitutional: Negative.  Negative for fatigue.  Respiratory: Negative.  Negative for cough, chest tightness, shortness of breath and wheezing.   Cardiovascular: Negative.  Negative for chest pain and palpitations.  Gastrointestinal: Negative.   Musculoskeletal:  Positive for arthralgias.  Neurological: Negative.     Vital Signs: BP 130/70 Comment: 150/88  Pulse 63   Temp 97.7 F (36.5 C)   Resp 16   Ht 5' (1.524 m)   Wt 122 lb (55.3 kg)   SpO2 96%   BMI 23.83 kg/m    Physical Exam Vitals reviewed.  Constitutional:      General: She is not in acute distress.    Appearance: Normal appearance. She is normal weight. She is not ill-appearing.  HENT:     Head: Normocephalic and atraumatic.  Eyes:     Pupils: Pupils are equal, round, and reactive to light.  Cardiovascular:     Rate and Rhythm: Normal rate and regular rhythm.  Pulmonary:     Effort: Pulmonary effort is normal. No respiratory distress.  Neurological:     Mental Status: She is alert and oriented to person, place, and time.  Psychiatric:        Mood and Affect: Mood normal.        Behavior: Behavior normal.        Assessment/Plan: 1. Essential hypertension, benign (Primary) ***  2. Acquired hypothyroidism ***  3. Chronic right shoulder pain *** - diclofenac  (VOLTAREN ) 75 MG EC tablet; Take 1 tablet (75 mg total) by mouth 2 (two) times daily.  Dispense: 60 tablet; Refill: 3  4. Age-related osteoporosis without current pathological fracture ***   General Counseling: Samantha Dennis verbalizes understanding of the findings of todays visit and agrees with plan of treatment. I have discussed any  further diagnostic evaluation that may be needed or ordered today. We also reviewed her medications today. she has been encouraged to call the office with any questions or concerns that should arise related to todays visit.    No orders of the defined types were placed in this encounter.   Meds ordered this encounter  Medications   diclofenac  (VOLTAREN ) 75 MG EC tablet    Sig: Take 1 tablet (75 mg total) by mouth 2 (two) times daily.    Dispense:  60 tablet    Refill:  3    Discontinue meloxicam  and fill new script today.    Return for previously scheduled, [facility], Phill Steck PCP in february. .   Total time spent:30 Minutes Time spent includes review of chart, medications, test results, and follow  up plan with the patient.    Controlled Substance Database was reviewed by me.  This patient was seen by Mardy Maxin, FNP-C in collaboration with Dr. Sigrid Bathe as a part of collaborative care agreement.   Verl Kitson R. Maxin, MSN, FNP-C Internal medicine

## 2024-02-05 NOTE — Patient Instructions (Signed)
 Stop boniva , treatment is completed

## 2024-02-18 DIAGNOSIS — M19011 Primary osteoarthritis, right shoulder: Secondary | ICD-10-CM | POA: Diagnosis not present

## 2024-03-04 DIAGNOSIS — M25511 Pain in right shoulder: Secondary | ICD-10-CM | POA: Diagnosis not present

## 2024-03-16 DIAGNOSIS — D225 Melanocytic nevi of trunk: Secondary | ICD-10-CM | POA: Diagnosis not present

## 2024-03-16 DIAGNOSIS — Z85828 Personal history of other malignant neoplasm of skin: Secondary | ICD-10-CM | POA: Diagnosis not present

## 2024-03-16 DIAGNOSIS — D2262 Melanocytic nevi of left upper limb, including shoulder: Secondary | ICD-10-CM | POA: Diagnosis not present

## 2024-03-16 DIAGNOSIS — L821 Other seborrheic keratosis: Secondary | ICD-10-CM | POA: Diagnosis not present

## 2024-03-16 DIAGNOSIS — D2271 Melanocytic nevi of right lower limb, including hip: Secondary | ICD-10-CM | POA: Diagnosis not present

## 2024-03-16 DIAGNOSIS — D2261 Melanocytic nevi of right upper limb, including shoulder: Secondary | ICD-10-CM | POA: Diagnosis not present

## 2024-03-16 DIAGNOSIS — D2272 Melanocytic nevi of left lower limb, including hip: Secondary | ICD-10-CM | POA: Diagnosis not present

## 2024-03-16 DIAGNOSIS — Z08 Encounter for follow-up examination after completed treatment for malignant neoplasm: Secondary | ICD-10-CM | POA: Diagnosis not present

## 2024-03-18 DIAGNOSIS — Z961 Presence of intraocular lens: Secondary | ICD-10-CM | POA: Diagnosis not present

## 2024-03-18 DIAGNOSIS — H43813 Vitreous degeneration, bilateral: Secondary | ICD-10-CM | POA: Diagnosis not present

## 2024-03-18 DIAGNOSIS — H353132 Nonexudative age-related macular degeneration, bilateral, intermediate dry stage: Secondary | ICD-10-CM | POA: Diagnosis not present

## 2024-03-24 DIAGNOSIS — M19011 Primary osteoarthritis, right shoulder: Secondary | ICD-10-CM | POA: Diagnosis not present

## 2024-04-06 ENCOUNTER — Other Ambulatory Visit: Payer: Self-pay | Admitting: Nurse Practitioner

## 2024-04-06 DIAGNOSIS — E039 Hypothyroidism, unspecified: Secondary | ICD-10-CM

## 2024-08-07 ENCOUNTER — Ambulatory Visit: Payer: Medicare HMO | Admitting: Nurse Practitioner

## 2024-08-11 ENCOUNTER — Ambulatory Visit: Admitting: Nurse Practitioner
